# Patient Record
Sex: Female | Born: 1975
Health system: Southern US, Community
[De-identification: ages and names within clinical notes are randomized; demographics above are authoritative.]

## PROBLEM LIST (undated history)

## (undated) ENCOUNTER — Ambulatory Visit (HOSPITAL_COMMUNITY): Payer: Managed Care, Other (non HMO) | Source: Home / Self Care

## (undated) DIAGNOSIS — R002 Palpitations: Secondary | ICD-10-CM

## (undated) HISTORY — PX: UTERINE FIBROID SURGERY: SHX826

## (undated) HISTORY — PX: CYSTECTOMY: SUR359

## (undated) HISTORY — DX: Palpitations: R00.2

## (undated) HISTORY — PX: WISDOM TOOTH EXTRACTION: SHX21

---

## 1997-05-17 ENCOUNTER — Encounter: Admission: RE | Admit: 1997-05-17 | Discharge: 1997-05-17 | Payer: Self-pay | Admitting: Internal Medicine

## 1997-07-05 ENCOUNTER — Encounter: Admission: RE | Admit: 1997-07-05 | Discharge: 1997-07-05 | Payer: Self-pay | Admitting: Internal Medicine

## 1997-11-02 ENCOUNTER — Encounter: Admission: RE | Admit: 1997-11-02 | Discharge: 1997-11-02 | Payer: Self-pay | Admitting: Internal Medicine

## 1998-08-19 ENCOUNTER — Emergency Department (HOSPITAL_COMMUNITY): Admission: EM | Admit: 1998-08-19 | Discharge: 1998-08-20 | Payer: Self-pay | Admitting: Emergency Medicine

## 1999-09-21 ENCOUNTER — Encounter: Admission: RE | Admit: 1999-09-21 | Discharge: 1999-09-21 | Payer: Self-pay | Admitting: Internal Medicine

## 2000-02-21 ENCOUNTER — Encounter: Admission: RE | Admit: 2000-02-21 | Discharge: 2000-02-21 | Payer: Self-pay

## 2000-03-06 ENCOUNTER — Encounter: Admission: RE | Admit: 2000-03-06 | Discharge: 2000-03-06 | Payer: Self-pay | Admitting: Internal Medicine

## 2000-04-11 ENCOUNTER — Encounter: Admission: RE | Admit: 2000-04-11 | Discharge: 2000-04-11 | Payer: Self-pay | Admitting: Internal Medicine

## 2000-04-18 ENCOUNTER — Emergency Department (HOSPITAL_COMMUNITY): Admission: EM | Admit: 2000-04-18 | Discharge: 2000-04-18 | Payer: Self-pay

## 2000-05-02 ENCOUNTER — Encounter: Admission: RE | Admit: 2000-05-02 | Discharge: 2000-07-31 | Payer: Self-pay | Admitting: Internal Medicine

## 2000-08-07 ENCOUNTER — Encounter: Admission: RE | Admit: 2000-08-07 | Discharge: 2000-11-05 | Payer: Self-pay | Admitting: Internal Medicine

## 2000-12-15 ENCOUNTER — Encounter: Admission: RE | Admit: 2000-12-15 | Discharge: 2000-12-15 | Payer: Self-pay | Admitting: Internal Medicine

## 2001-05-28 ENCOUNTER — Encounter: Payer: Self-pay | Admitting: Internal Medicine

## 2001-05-28 ENCOUNTER — Encounter: Admission: RE | Admit: 2001-05-28 | Discharge: 2001-05-28 | Payer: Self-pay | Admitting: Internal Medicine

## 2001-05-28 ENCOUNTER — Ambulatory Visit (HOSPITAL_COMMUNITY): Admission: RE | Admit: 2001-05-28 | Discharge: 2001-05-28 | Payer: Self-pay | Admitting: Internal Medicine

## 2001-06-01 ENCOUNTER — Encounter: Admission: RE | Admit: 2001-06-01 | Discharge: 2001-06-01 | Payer: Self-pay | Admitting: Internal Medicine

## 2001-06-23 ENCOUNTER — Encounter: Admission: RE | Admit: 2001-06-23 | Discharge: 2001-06-23 | Payer: Self-pay | Admitting: Internal Medicine

## 2001-12-31 ENCOUNTER — Encounter: Admission: RE | Admit: 2001-12-31 | Discharge: 2001-12-31 | Payer: Self-pay | Admitting: Internal Medicine

## 2001-12-31 ENCOUNTER — Emergency Department (HOSPITAL_COMMUNITY): Admission: EM | Admit: 2001-12-31 | Discharge: 2001-12-31 | Payer: Self-pay | Admitting: Emergency Medicine

## 2002-05-01 ENCOUNTER — Emergency Department (HOSPITAL_COMMUNITY): Admission: EM | Admit: 2002-05-01 | Discharge: 2002-05-02 | Payer: Self-pay | Admitting: Emergency Medicine

## 2002-05-25 ENCOUNTER — Encounter: Admission: RE | Admit: 2002-05-25 | Discharge: 2002-05-25 | Payer: Self-pay | Admitting: Obstetrics and Gynecology

## 2002-06-24 ENCOUNTER — Encounter: Admission: RE | Admit: 2002-06-24 | Discharge: 2002-06-24 | Payer: Self-pay | Admitting: Obstetrics and Gynecology

## 2002-06-24 ENCOUNTER — Encounter (INDEPENDENT_AMBULATORY_CARE_PROVIDER_SITE_OTHER): Payer: Self-pay

## 2002-06-24 ENCOUNTER — Other Ambulatory Visit: Admission: RE | Admit: 2002-06-24 | Discharge: 2002-06-24 | Payer: Self-pay | Admitting: *Deleted

## 2002-08-19 ENCOUNTER — Encounter: Admission: RE | Admit: 2002-08-19 | Discharge: 2002-08-19 | Payer: Self-pay | Admitting: Obstetrics and Gynecology

## 2003-05-03 ENCOUNTER — Encounter (INDEPENDENT_AMBULATORY_CARE_PROVIDER_SITE_OTHER): Payer: Self-pay | Admitting: *Deleted

## 2003-05-03 ENCOUNTER — Encounter: Admission: RE | Admit: 2003-05-03 | Discharge: 2003-05-03 | Payer: Self-pay | Admitting: Obstetrics and Gynecology

## 2003-07-07 ENCOUNTER — Emergency Department (HOSPITAL_COMMUNITY): Admission: EM | Admit: 2003-07-07 | Discharge: 2003-07-07 | Payer: Self-pay | Admitting: Emergency Medicine

## 2003-09-15 ENCOUNTER — Emergency Department (HOSPITAL_COMMUNITY): Admission: EM | Admit: 2003-09-15 | Discharge: 2003-09-15 | Payer: Self-pay | Admitting: Family Medicine

## 2003-10-12 ENCOUNTER — Emergency Department (HOSPITAL_COMMUNITY): Admission: EM | Admit: 2003-10-12 | Discharge: 2003-10-12 | Payer: Self-pay | Admitting: Family Medicine

## 2003-10-27 ENCOUNTER — Ambulatory Visit: Payer: Self-pay | Admitting: Obstetrics and Gynecology

## 2004-01-24 ENCOUNTER — Ambulatory Visit: Payer: Self-pay | Admitting: Obstetrics & Gynecology

## 2004-07-24 ENCOUNTER — Emergency Department (HOSPITAL_COMMUNITY): Admission: EM | Admit: 2004-07-24 | Discharge: 2004-07-24 | Payer: Self-pay | Admitting: Emergency Medicine

## 2004-08-18 ENCOUNTER — Emergency Department (HOSPITAL_COMMUNITY): Admission: EM | Admit: 2004-08-18 | Discharge: 2004-08-18 | Payer: Self-pay | Admitting: Family Medicine

## 2004-10-30 ENCOUNTER — Ambulatory Visit: Payer: Self-pay | Admitting: Obstetrics and Gynecology

## 2004-10-30 ENCOUNTER — Encounter (INDEPENDENT_AMBULATORY_CARE_PROVIDER_SITE_OTHER): Payer: Self-pay | Admitting: *Deleted

## 2004-12-16 ENCOUNTER — Emergency Department (HOSPITAL_COMMUNITY): Admission: EM | Admit: 2004-12-16 | Discharge: 2004-12-16 | Payer: Self-pay | Admitting: Emergency Medicine

## 2005-06-05 IMAGING — CR DG CHEST 2V
2 series · 2 of 2 positions shown · non-contrast
Comparison: none

CLINICAL DATA: Cough, chest pain, shortness of breath. 
 TWO VIEW CHEST RADIOGRAPH ? 07/07/2003
 Comparing 05/28/2001. 
 The heart size and mediastinal contours are unremarkable.  The lungs are clear.  The visualized skeleton is unremarkable.  
 IMPRESSION
 No active disease.

[view not recorded (1 of 2)]
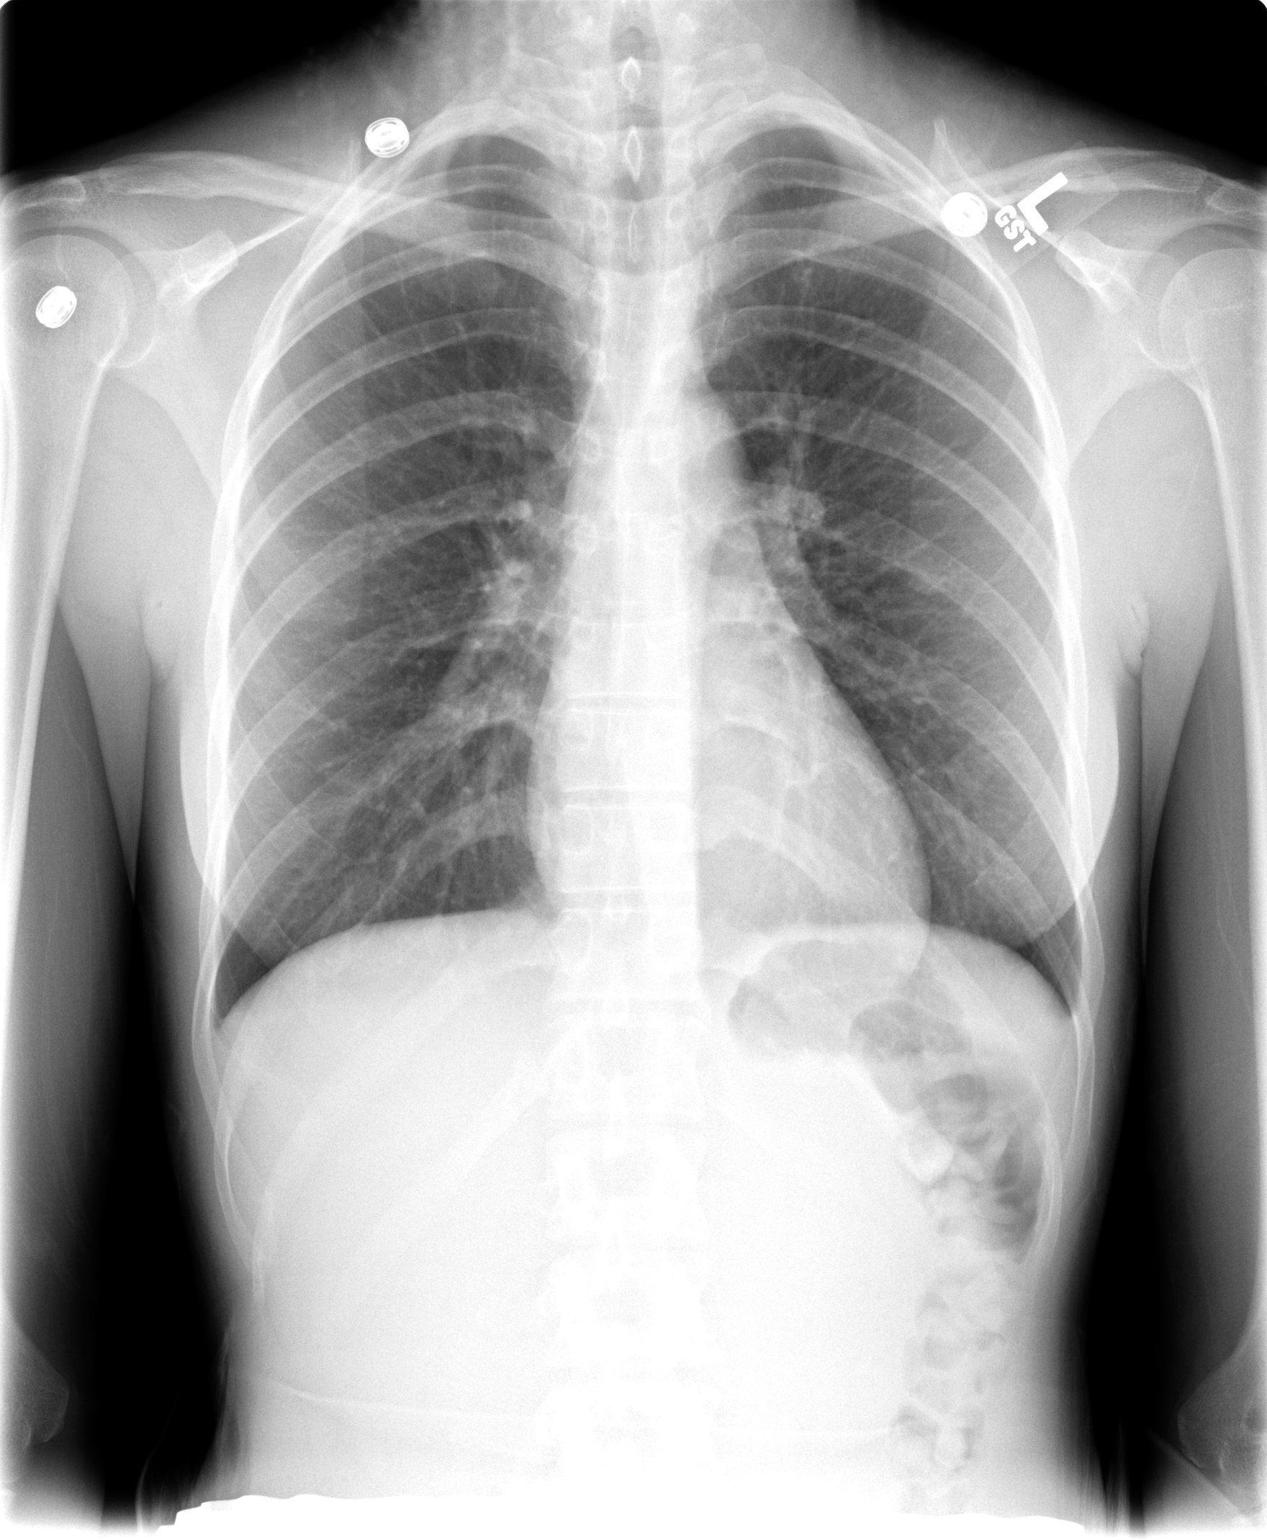

[view not recorded (2 of 2)]
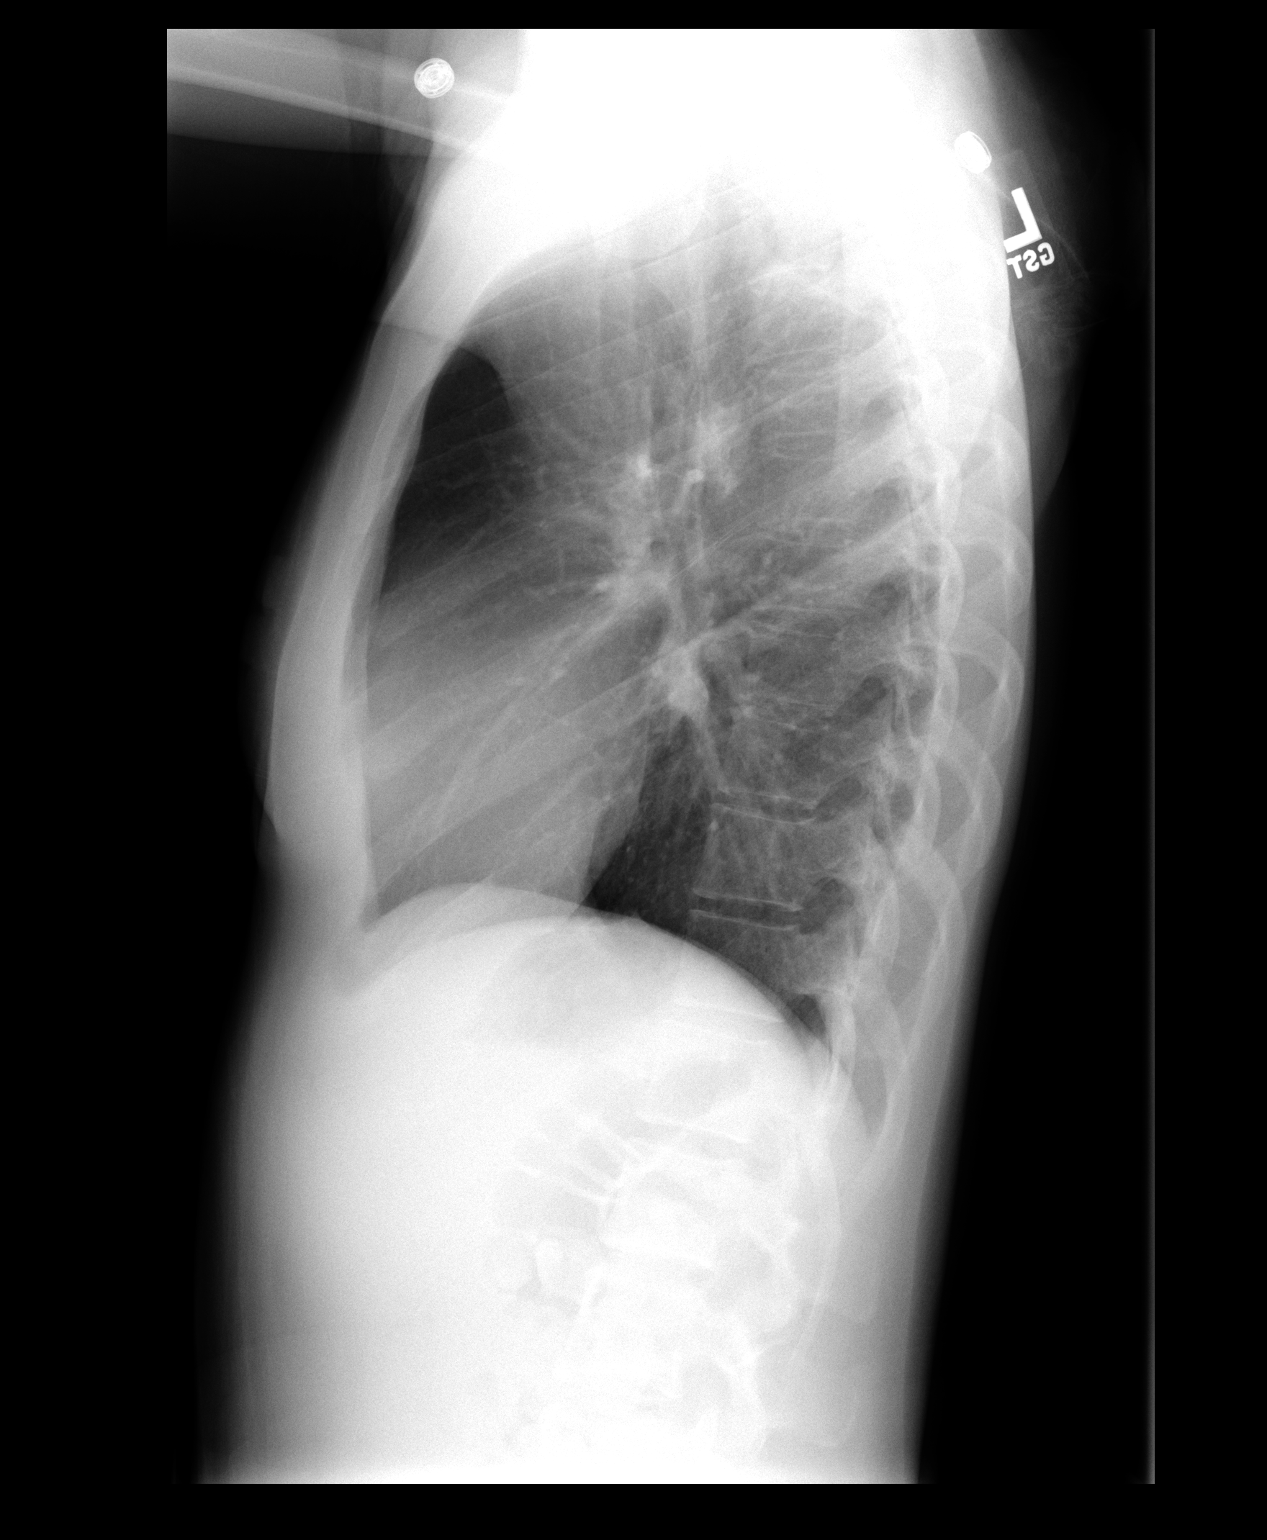

[2 of 2 positions shown; findings below may reference images not displayed]

## 2005-08-22 ENCOUNTER — Ambulatory Visit: Payer: Self-pay | Admitting: Obstetrics and Gynecology

## 2005-10-10 ENCOUNTER — Encounter (INDEPENDENT_AMBULATORY_CARE_PROVIDER_SITE_OTHER): Payer: Self-pay | Admitting: Obstetrics & Gynecology

## 2005-10-10 ENCOUNTER — Ambulatory Visit: Payer: Self-pay | Admitting: Obstetrics & Gynecology

## 2005-10-22 ENCOUNTER — Ambulatory Visit: Payer: Self-pay | Admitting: Obstetrics and Gynecology

## 2005-12-11 ENCOUNTER — Ambulatory Visit: Payer: Self-pay | Admitting: Obstetrics and Gynecology

## 2006-01-21 ENCOUNTER — Emergency Department (HOSPITAL_COMMUNITY): Admission: EM | Admit: 2006-01-21 | Discharge: 2006-01-21 | Payer: Self-pay | Admitting: Family Medicine

## 2006-02-23 ENCOUNTER — Emergency Department (HOSPITAL_COMMUNITY): Admission: EM | Admit: 2006-02-23 | Discharge: 2006-02-23 | Payer: Self-pay | Admitting: Emergency Medicine

## 2006-06-04 ENCOUNTER — Ambulatory Visit: Payer: Self-pay | Admitting: Obstetrics and Gynecology

## 2006-08-20 ENCOUNTER — Ambulatory Visit: Payer: Self-pay | Admitting: Obstetrics and Gynecology

## 2006-10-15 ENCOUNTER — Ambulatory Visit: Payer: Self-pay | Admitting: *Deleted

## 2006-10-15 ENCOUNTER — Encounter: Payer: Self-pay | Admitting: Obstetrics and Gynecology

## 2006-10-15 ENCOUNTER — Encounter: Payer: Self-pay | Admitting: Obstetrics & Gynecology

## 2007-03-30 ENCOUNTER — Emergency Department (HOSPITAL_COMMUNITY): Admission: EM | Admit: 2007-03-30 | Discharge: 2007-03-30 | Payer: Self-pay | Admitting: Family Medicine

## 2007-10-16 ENCOUNTER — Emergency Department (HOSPITAL_COMMUNITY): Admission: EM | Admit: 2007-10-16 | Discharge: 2007-10-16 | Payer: Self-pay | Admitting: Family Medicine

## 2009-02-09 ENCOUNTER — Ambulatory Visit (HOSPITAL_COMMUNITY): Admission: RE | Admit: 2009-02-09 | Discharge: 2009-02-09 | Payer: Self-pay | Admitting: Obstetrics and Gynecology

## 2010-05-14 LAB — CBC
MCHC: 31.5 g/dL (ref 30.0–36.0)
MCV: 76.8 fL — ABNORMAL LOW (ref 78.0–100.0)
Platelets: 390 10*3/uL (ref 150–400)

## 2010-05-14 LAB — BASIC METABOLIC PANEL
BUN: 15 mg/dL (ref 6–23)
CO2: 25 mEq/L (ref 19–32)
Calcium: 8.7 mg/dL (ref 8.4–10.5)
Chloride: 107 mEq/L (ref 96–112)
Creatinine, Ser: 0.61 mg/dL (ref 0.4–1.2)
GFR calc Af Amer: >60 mL/min (ref >60)
GFR calc non Af Amer: >60 mL/min (ref >60)
Glucose, Bld: 61 mg/dL — ABNORMAL LOW (ref 70–99)
Potassium: 3.8 mEq/L (ref 3.5–5.1)
Sodium: 135 mEq/L (ref 135–145)

## 2010-05-14 LAB — PREGNANCY, URINE: Preg Test, Ur: NEGATIVE

## 2010-06-26 NOTE — Group Therapy Note (Signed)
NAME:  Pamela Holland, Pamela Holland NO.:  [id number]   MEDICAL RECORD NO.:  [id number]          PATIENT TYPE:  WOC   LOCATION:  [facility]                   FACILITY:  [facility]   PHYSICIAN:  Elsie Lincoln, MD      DATE OF BIRTH:  1976/01/08   DATE OF SERVICE:  08/20/2006                                  CLINIC NOTE   CHIEF COMPLAINT:  Heavy bleeding on Depo-Provera.   HISTORY OF PRESENT ILLNESS:  This is a 35 year old African-American  female who is due today for her second Depo-Provera injection.  Her  first injection was in April of 2008 and since then she has had  irregular and heavy bleeding ever since then.  She also has had four  migraines since introduction of Depo-Provera which she has never had in  the past as well as some heavy menstrual cramping.  She does not want to  continue Depo-Provera at this time and is interested in discussing birth  control options.  She utilizes birth control mainly for a history of  heavy periods with menstrual cramping.  She is not sexually active and  does not plan on becoming sexually active any time in the near future.  She has used Ortho-Tri-Cyclen in the past with a side effect of heavy  bleeding.  She then switched to Ovcon 35 FE.  The side effect of this  oral contraceptive pill is unknown.  She then tried the NuvaRing which  also resulted in a side effect of heavy bleeding.  She has since been on  Depo-Provera with the same side effect.   PHYSICAL EXAMINATION:  VITAL SIGNS:  Stable.  GENERAL APPEARANCE:  The patient is alert, oriented and pleasant,  appears in no acute distress.   ASSESSMENT/PLAN:  A 35 year old with a history of heavy periods and  menstrual cramping here for advice on birth control.  She was given a  one month supply of Yaz oral contraception as well as a prescription  with three refills.  If she still has breakthrough bleeding on this oral  contraceptive pill, we may consider a contraceptive pill with a higher  estrogen dose.  She will return to clinic in September 2008 for her  yearly annual with Pap smear.     ______________________________  Sylvan Cheese, MD    ______________________________  Elsie Lincoln, MD    MJ/MEDQ  D:  08/20/2006  T:  08/21/2006  Job:  [id number]

## 2010-06-29 NOTE — Group Therapy Note (Signed)
NAME:  Pamela Holland, GANDOLFI NO.:  [id number]   MEDICAL RECORD NO.:  [id number]          PATIENT TYPE:  WOC   LOCATION:  [facility]                   FACILITY:  [facility]   PHYSICIAN:  Dorthula Perfect, MD     DATE OF BIRTH:  1975/11/03   DATE OF SERVICE:  10/10/2005                                    CLINIC NOTE   A 35 year old black female returns for her yearly Pap smear.  Over the past  year or so she has had problems with bleeding and spotting despite being on  a variety of different birth control pills.  Two months ago the NuvaRing was  prescribed for her.  Her first period was described as very heavy with  especially heavy for 3 days.  Her last period, September 26, 2005, was markedly  better with only a moderate amount of bleeding on the first day.  She does  have some cramps.   PHYSICAL EXAMINATION:  BREASTS:  Breasts are normal.  ABDOMEN:  Abdomen is soft, nontender.  No masses are felt.  PELVIC EXAM:  External genitalia, and __________ glands were normal.  __________  cervix.  Uterus was in the midline of normal size and shape.  Adnexal structures were normal.  No masses were felt.   IMPRESSION:  1. Normal GYN exam.  2. Contraception.   DISPOSITION:  1. Pap smear.  2. Patient will continue to use the NuvaRing for at least the next few      months.  Hopefully, as her system gets used to the low dose that her      periods will become lighter and will not be a problem.  She is willing      to wait this out at least for the next few months.           ______________________________  Dorthula Perfect, MD     ER/MEDQ  D:  10/10/2005  T:  10/11/2005  Job:  [id number]

## 2010-06-29 NOTE — Group Therapy Note (Signed)
NAME:  MELLINA, BENISON NO.:  [id number]   MEDICAL RECORD NO.:  [id number]          PATIENT TYPE:  WOC   LOCATION:  [facility]                   FACILITY:  [facility]   PHYSICIAN:  Argentina Donovan, MD        DATE OF BIRTH:  Sep 21, 1975   DATE OF SERVICE:                                    CLINIC NOTE   The patient is a 35 year old African American female who has been several  months on the Nuva  ring.  She has been getting a vaginal irritation and she  love the Nuva ring but thinks it may be related.  We have told her to leave  the Nuva ring out.  We gave her several samples of Loestrin FE and some  information on the __________  and Mirena as an alternative if we do find  that this is the Nuva ring that is causing her irritation.   PHYSICAL EXAMINATION:  EXTERNAL GENITALIA:  Normal with an obvious heavy  creamy discharge noted at the introitus.  BUS within normal limits.  PELVIC:  Vagina is clean with a heavy milky watery discharge in the cul-de-  sac.  Cervix is clean and parous.   A wet prep and a DNA for GC and chlamydia were taken.  We will call the  patient with those results.  She will let us know what she wants to use as a  substitute.  She is to start the birth control pills today, so there is no  break in her coverage.   IMPRESSION:  Vaginal irritation, possibly secondary to Nuva ring.   Evaluation for vaginal infection carried out           ______________________________  Argentina Donovan, MD     PR/MEDQ  D:  12/11/2005  T:  12/11/2005  Job:  [id number]
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
## 2010-06-29 NOTE — Group Therapy Note (Signed)
NAME:  Pamela Holland, Pamela Holland NO.:  [id number]   MEDICAL RECORD NO.:  [id number]                   PATIENT TYPE:  OUT   LOCATION:  [facility]                           FACILITY:  [facility]   PHYSICIAN:  Argentina Donovan, MD                     DATE OF BIRTH:  09/26/1975   DATE OF SERVICE:  05/03/2003                                    CLINIC NOTE   REASON FOR VISIT:  Pamela Holland is a 35 year old African-American female that  is here for repeat Pap smear.  The patient was first seen here in February  2004 with an LSIL and HPV.  She had a colposcopy done on Jun 24, 2002 and a  follow-up Pap smear done in July 2004 that was okay.  She is now here for  repeat Pap smear 6 months later.  The patient has been taking Ortho Tri-  Cyclen as she does not smoke.  She also wants to get information on  Seasonale birth control pills.   MEDICATIONS:  None at this point.   PAST MEDICAL HISTORY:  Uneventful.   REVIEW OF SYSTEMS:  Essentially normal.   GYNECOLOGICAL HISTORY:  The patient's last menstrual period was April 20, 2003.  She is a gravida 0 para 0.   PHYSICAL EXAMINATION:  VITAL SIGNS:  Her temperature is 98.3, blood pressure  is 114/69, her pulse is 78, her weight is 116.7  GENERAL:  She is alert, oriented, thin woman in no acute distress.  CHEST:  Clear to auscultation bilaterally.  CARDIOVASCULAR:  She has regular rate and rhythm.  ABDOMEN:  Soft, nontender, with positive bowel sounds.  EXTREMITIES:  Show no edema, cyanosis, or clubbing.  PELVIC:  Showed normal vulva, posterior cervix.  There is no evidence of  bleed or discharge.  Pap smear is performed today.   ASSESSMENT AND PLAN:  1. Follow-up Pap smear.  The patient's Pap smear is performed today and we     will check it.  The plan is if it is normal the patient will go back to     Pap smear once a year.  If is it abnormal then the patient will be     reviewed again and have a Pap smear done probably in 3 months  rather than     a year.  2. Birth control pills.  The patient was given information on the Seasonale     birth control pills for her to read and make a decision.  In the     meantime, I have refilled her Ortho Tri-Cyclen.     Cassandria Santee, M.D.                Argentina Donovan, MD    MLG/MEDQ  D:  05/03/2003  T:  05/03/2003  Job:  [id number]

## 2010-11-02 LAB — STREP A DNA PROBE

## 2010-11-02 LAB — POCT RAPID STREP A: Streptococcus, Group A Screen (Direct): NEGATIVE

## 2011-12-20 ENCOUNTER — Emergency Department (HOSPITAL_COMMUNITY)
Admission: EM | Admit: 2011-12-20 | Discharge: 2011-12-20 | Disposition: A | Discharge status: Home / Self Care | Payer: 59 | Source: Home / Self Care

## 2011-12-20 ENCOUNTER — Encounter (HOSPITAL_COMMUNITY): Payer: Self-pay | Admitting: Emergency Medicine

## 2011-12-20 DIAGNOSIS — J309 Allergic rhinitis, unspecified: Secondary | ICD-10-CM

## 2011-12-20 MED ORDER — PHENYLEPHRINE-CHLORPHEN-DM 10-4-12.5 MG/5ML PO LIQD: 5.0000 mL | ORAL | Status: DC | PRN

## 2011-12-20 NOTE — ED Provider Notes (Signed)
Medical screening examination/treatment/procedure(s) were performed by non-physician practitioner and as supervising physician I was immediately available for consultation/collaboration.  Leslee Home, M.D.   Reuben Likes, MD 12/20/11 2109

## 2011-12-20 NOTE — ED Notes (Signed)
Reports runny nose, alternating watery eyes, cough.  Reports that sputum was a lime green initially.  now patient reports she cannot cough anything up.  Denies fever.  Reports tickle in throat triggering a repetitive cough

## 2011-12-20 NOTE — ED Provider Notes (Signed)
History     CSN: [id number]  Arrival date & time 12/20/11  1912   None     Chief Complaint  Patient presents with  . URI    (Consider location/radiation/quality/duration/timing/severity/associated sxs/prior treatment) HPI Comments: 36 year old female is had a cough for several weeks. She is complaining of runny nose, watery and itchy eyes, PND and equivocal sore throat. She denies fever or earache.  Patient is a 36 y.o. female presenting with URI.  URI The primary symptoms include fatigue and sore throat. Primary symptoms do not include fever or rash.  Symptoms associated with the illness include congestion and rhinorrhea. The illness is not associated with chills.    History reviewed. No pertinent past medical history.  History reviewed. No pertinent past surgical history.  No family history on file.  History  Substance Use Topics  . Smoking status: Never Smoker   . Smokeless tobacco: Not on file  . Alcohol Use: No    OB History    Grav Para Term Preterm Abortions TAB SAB Ect Mult Living                  Review of Systems  Constitutional: Positive for fatigue. Negative for fever, chills, activity change and appetite change.  HENT: Positive for congestion, sore throat, rhinorrhea and postnasal drip. Negative for facial swelling, neck pain and neck stiffness.   Eyes: Positive for discharge and itching.  Respiratory: Negative.   Cardiovascular: Negative.   Gastrointestinal: Negative.   Skin: Negative for pallor and rash.  Neurological: Negative.     Allergies  Review of patient's allergies indicates no known allergies.  Home Medications   Current Outpatient Rx  Name  Route  Sig  Dispense  Refill  . VICKS VAPORUB EX   Apply externally   Apply topically.         . NYQUIL PO   Oral   Take by mouth.         Marland Kitchen PHENYLEPHRINE-CHLORPHEN-DM 11-15-10.5 MG/5ML PO LIQD   Oral   Take 5 mLs by mouth every 4 (four) hours as needed.   120 mL   0     BP  147/73  Pulse 84  Temp 99.2 F (37.3 C) (Oral)  Resp 22  SpO2 100%  LMP 11/30/2011  Physical Exam  Constitutional: She is oriented to person, place, and time. She appears well-developed and well-nourished. No distress.  HENT:  Right Ear: External ear normal.  Left Ear: External ear normal.       OP with minor erythema and clear to slightly opaque PND.  Eyes: Conjunctivae normal and EOM are normal.  Neck: Normal range of motion. Neck supple.  Cardiovascular: Normal rate, regular rhythm and normal heart sounds.   Pulmonary/Chest: Effort normal and breath sounds normal. No respiratory distress. She has no wheezes.  Musculoskeletal: Normal range of motion. She exhibits no edema.  Lymphadenopathy:    She has no cervical adenopathy.  Neurological: She is alert and oriented to person, place, and time.  Skin: Skin is warm and dry. No rash noted.  Psychiatric: She has a normal mood and affect.    ED Course  Procedures (including critical care time)  Labs Reviewed - No data to display No results found.   1. Allergic rhinitis       MDM  Norell CS 1 teaspoon every 4 hours when necessary cough and drainage. Drink plenty of fluids stay well hydrated He may benefit from seeing an allergist. Also follow up with  your primary care provider        Hayden Rasmussen, NP 12/20/11 [phone]

## 2012-06-08 ENCOUNTER — Emergency Department (HOSPITAL_COMMUNITY)
Admission: EM | Admit: 2012-06-08 | Discharge: 2012-06-08 | Disposition: A | Discharge status: Home / Self Care | Payer: 59 | Source: Home / Self Care | Attending: Emergency Medicine | Admitting: Emergency Medicine

## 2012-06-08 ENCOUNTER — Encounter (HOSPITAL_COMMUNITY): Payer: Self-pay | Admitting: Emergency Medicine

## 2012-06-08 DIAGNOSIS — H6692 Otitis media, unspecified, left ear: Secondary | ICD-10-CM

## 2012-06-08 DIAGNOSIS — H6690 Otitis media, unspecified, unspecified ear: Secondary | ICD-10-CM

## 2012-06-08 MED ORDER — HYDROCODONE-ACETAMINOPHEN 5-325 MG PO TABS: 2.0000 | ORAL_TABLET | ORAL | Status: DC | PRN

## 2012-06-08 MED ORDER — AUGMENTIN 500-125 MG PO TABS: 1.0000 | ORAL_TABLET | Freq: Three times a day (TID) | ORAL | Status: DC

## 2012-06-08 MED ORDER — ANTIPYRINE-BENZOCAINE 5.4-1.4 % OT SOLN: 3.0000 [drp] | OTIC | Status: DC | PRN

## 2012-06-08 NOTE — ED Provider Notes (Signed)
History     CSN: [id number]  Arrival date & time 06/08/12  1247   First MD Initiated Contact with Patient 06/08/12 1419      Chief Complaint  Patient presents with  . Otalgia    (Consider location/radiation/quality/duration/timing/severity/associated sxs/prior treatment) The history is provided by the patient.  c/o left earache upon waking up this morning No fever, sore throat or sinus congestion; had sore throat and sinus drainage 2 weeks ago  History reviewed. No pertinent past medical history. No past medical history  Past Surgical History  Procedure Laterality Date  . Cystectomy     No past surgical history No family history on file. noncontribuyory History  Substance Use Topics  . Smoking status: Never Smoker   . Smokeless tobacco: Not on file  . Alcohol Use: No    OB History   Grav Para Term Preterm Abortions TAB SAB Ect Mult Living                  Review of Systems  Constitutional: Negative.   HENT: Positive for ear pain.   All other systems reviewed and are negative.    Allergies  Review of patient's allergies indicates no known allergies.  Home Medications   Current Outpatient Rx  Name  Route  Sig  Dispense  Refill  . antipyrine-benzocaine (AURALGAN) otic solution   Left Ear   Place 3 drops into the left ear every 4 (four) hours as needed for pain.   10 mL   0   . AUGMENTIN 500-125 MG per tablet   Oral   Take 1 tablet (500 mg total) by mouth 3 (three) times daily.   30 tablet   0     Dispense as written.   . Camphor-Eucalyptus-Menthol (VICKS VAPORUB EX)   Apply externally   Apply topically.         Marland Kitchen HYDROcodone-acetaminophen (NORCO/VICODIN) 5-325 MG per tablet   Oral   Take 2 tablets by mouth every 4 (four) hours as needed for pain.   15 tablet   0   . Phenylephrine-Chlorphen-DM 11-15-10.5 MG/5ML LIQD   Oral   Take 5 mLs by mouth every 4 (four) hours as needed.   120 mL   0   . Pseudoeph-Doxylamine-DM-APAP (NYQUIL PO)   Oral   Take by mouth.           BP 112/79  Pulse 63  Temp(Src) 98.3 F (36.8 C) (Oral)  Resp 16  SpO2 100%  LMP 05/23/2012  Physical Exam  Constitutional: She appears well-developed and well-nourished.  HENT:  Right Ear: External ear normal.  LEFT EAR SHOWED NORMAL PINNA,EXTERNAL CANAL,HYPEREMIC TM ,INTACT WITH NO DRAINAGE  Skin: Skin is warm.    ED Course  Procedures (including critical care time)  Labs Reviewed - No data to display No results found.   1. Otitis media of left ear       MDM          Jani Files, MD 06/10/12 [id number]

## 2012-06-08 NOTE — ED Notes (Signed)
Pt c/o left ear pain onset this am Also c/o cold sx onset 1.5 weeks Sx include: nasal congestion, post nasal drip,   Denies: f/v/n/d, cough Taking advil for discomfort w/temp relief.   She is alert and oriented w/no signs of acute distress.

## 2013-03-23 ENCOUNTER — Encounter (HOSPITAL_COMMUNITY): Payer: Self-pay | Admitting: Emergency Medicine

## 2013-03-23 ENCOUNTER — Emergency Department (INDEPENDENT_AMBULATORY_CARE_PROVIDER_SITE_OTHER)
Admission: EM | Admit: 2013-03-23 | Discharge: 2013-03-23 | Disposition: A | Discharge status: Home / Self Care | Payer: Managed Care, Other (non HMO) | Source: Home / Self Care | Attending: Family Medicine | Admitting: Family Medicine

## 2013-03-23 DIAGNOSIS — J020 Streptococcal pharyngitis: Secondary | ICD-10-CM

## 2013-03-23 MED ORDER — AMOXICILLIN 875 MG PO TABS: 875.0000 mg | ORAL_TABLET | Freq: Two times a day (BID) | ORAL | Status: DC

## 2013-03-23 MED ORDER — METHYLPREDNISOLONE 4 MG PO KIT: PACK | ORAL | Status: DC

## 2013-03-23 MED ORDER — HYDROCODONE-ACETAMINOPHEN 5-325 MG PO TABS: 1.0000 | ORAL_TABLET | Freq: Four times a day (QID) | ORAL | Status: DC | PRN

## 2013-03-23 NOTE — Discharge Instructions (Signed)
Strep Throat  Strep throat is an infection of the throat caused by a bacteria named Streptococcus pyogenes. Your caregiver may call the infection streptococcal "tonsillitis" or "pharyngitis" depending on whether there are signs of inflammation in the tonsils or back of the throat. Strep throat is most common in children aged 36 15 years during the cold months of the year, but it can occur in people of any age during any season. This infection is spread from person to person (contagious) through coughing, sneezing, or other close contact.  SYMPTOMS   · Fever or chills.  · Painful, swollen, red tonsils or throat.  · Pain or difficulty when swallowing.  · White or yellow spots on the tonsils or throat.  · Swollen, tender lymph nodes or "glands" of the neck or under the jaw.  · Red rash all over the body (rare).  DIAGNOSIS   Many different infections can cause the same symptoms. A test must be done to confirm the diagnosis so the right treatment can be given. A "rapid strep test" can help your caregiver make the diagnosis in a few minutes. If this test is not available, a light swab of the infected area can be used for a throat culture test. If a throat culture test is done, results are usually available in a day or two.  TREATMENT   Strep throat is treated with antibiotic medicine.  HOME CARE INSTRUCTIONS   · Gargle with 1 tsp of salt in 1 cup of warm water, 3 4 times per day or as needed for comfort.  · Family members who also have a sore throat or fever should be tested for strep throat and treated with antibiotics if they have the strep infection.  · Make sure everyone in your household washes their hands well.  · Do not share food, drinking cups, or personal items that could cause the infection to spread to others.  · You may need to eat a soft food diet until your sore throat gets better.  · Drink enough water and fluids to keep your urine clear or pale yellow. This will help prevent dehydration.  · Get plenty of  rest.  · Stay home from school, daycare, or work until you have been on antibiotics for 24 hours.  · Only take over-the-counter or prescription medicines for pain, discomfort, or fever as directed by your caregiver.  · If antibiotics are prescribed, take them as directed. Finish them even if you start to feel better.  SEEK MEDICAL CARE IF:   · The glands in your neck continue to enlarge.  · You develop a rash, cough, or earache.  · You cough up green, yellow-brown, or bloody sputum.  · You have pain or discomfort not controlled by medicines.  · Your problems seem to be getting worse rather than better.  SEEK IMMEDIATE MEDICAL CARE IF:   · You develop any new symptoms such as vomiting, severe headache, stiff or painful neck, chest pain, shortness of breath, or trouble swallowing.  · You develop severe throat pain, drooling, or changes in your voice.  · You develop swelling of the neck, or the skin on the neck becomes red and tender.  · You have a fever.  · You develop signs of dehydration, such as fatigue, dry mouth, and decreased urination.  · You become increasingly sleepy, or you cannot wake up completely.  Document Released: 01/26/2000 Document Revised: 01/15/2012 Document Reviewed: 03/29/2010  [employer]® Patient Information ©2014 [employer].

## 2013-03-23 NOTE — ED Notes (Signed)
Pt triaged and assessed by provider.   Provider in before nurse. 

## 2013-03-23 NOTE — ED Provider Notes (Signed)
CSN: [id number]     Arrival date & time 03/23/13  1920 History   First MD Initiated Contact with Patient 03/23/13 2004     Chief Complaint  Patient presents with  . URI     (Consider location/radiation/quality/duration/timing/severity/associated sxs/prior Treatment) HPI Comments: 38 year old female presents complaining of sore throat, headache, neck pain, subjective fever and chills. This has been going on since yesterday with throat, and fever and chills first started this morning. She has not yet taken any medications for this. She denies cough, nausea, vomiting, congestion, rhinorrhea   No past medical history on file. Past Surgical History  Procedure Laterality Date  . Cystectomy     No family history on file. History  Substance Use Topics  . Smoking status: Never Smoker   . Smokeless tobacco: Not on file  . Alcohol Use: No   OB History   Grav Para Term Preterm Abortions TAB SAB Ect Mult Living                 Review of Systems  Constitutional: Positive for fever, chills and fatigue.  HENT: Positive for sore throat.   Eyes: Negative for visual disturbance.  Respiratory: Negative for cough and shortness of breath.   Cardiovascular: Negative for chest pain, palpitations and leg swelling.  Gastrointestinal: Negative for nausea, vomiting and abdominal pain.  Endocrine: Negative for polydipsia and polyuria.  Genitourinary: Negative for dysuria, urgency and frequency.  Musculoskeletal: Negative for arthralgias and myalgias.  Skin: Negative for rash.  Neurological: Positive for dizziness and headaches. Negative for weakness and light-headedness.      Allergies  Review of patient's allergies indicates no known allergies.  Home Medications   Current Outpatient Rx  Name  Route  Sig  Dispense  Refill  . amoxicillin (AMOXIL) 875 MG tablet   Oral   Take 1 tablet (875 mg total) by mouth 2 (two) times daily.   14 tablet   0   . antipyrine-benzocaine (AURALGAN) otic  solution   Left Ear   Place 3 drops into the left ear every 4 (four) hours as needed for pain.   10 mL   0   . AUGMENTIN 500-125 MG per tablet   Oral   Take 1 tablet (500 mg total) by mouth 3 (three) times daily.   30 tablet   0     Dispense as written.   . Camphor-Eucalyptus-Menthol (VICKS VAPORUB EX)   Apply externally   Apply topically.         Marland Kitchen HYDROcodone-acetaminophen (NORCO) 5-325 MG per tablet   Oral   Take 1 tablet by mouth every 6 (six) hours as needed for moderate pain.   15 tablet   0   . HYDROcodone-acetaminophen (NORCO/VICODIN) 5-325 MG per tablet   Oral   Take 2 tablets by mouth every 4 (four) hours as needed for pain.   15 tablet   0   . methylPREDNISolone (MEDROL DOSEPAK) 4 MG tablet      follow package directions   21 tablet   0     Dispense as written.   Marland Kitchen Phenylephrine-Chlorphen-DM 11-15-10.5 MG/5ML LIQD   Oral   Take 5 mLs by mouth every 4 (four) hours as needed.   120 mL   0   . Pseudoeph-Doxylamine-DM-APAP (NYQUIL PO)   Oral   Take by mouth.          BP 112/76  Pulse 88  Temp(Src) 99.5 F (37.5 C) (Oral)  Resp 12  SpO2 99%  Physical Exam  Nursing note and vitals reviewed. Constitutional: She is oriented to person, place, and time. Vital signs are normal. She appears well-developed and well-nourished. No distress.  HENT:  Head: Normocephalic and atraumatic.  Nose: Nose normal. Right sinus exhibits no maxillary sinus tenderness and no frontal sinus tenderness. Left sinus exhibits no maxillary sinus tenderness and no frontal sinus tenderness.  Mouth/Throat: Uvula is midline. Oropharyngeal exudate and posterior oropharyngeal erythema present. No posterior oropharyngeal edema.  Equal bilateral Tonsillar enlargements with purulence/exudate  Neck: Normal range of motion. Neck supple.  Cardiovascular: Normal rate, regular rhythm and normal heart sounds.   Pulmonary/Chest: Effort normal and breath sounds normal. No respiratory  distress.  Lymphadenopathy:    She has cervical adenopathy (tonsillar, posterior cervical, tender).  Neurological: She is alert and oriented to person, place, and time. She has normal strength. Coordination normal.  Skin: Skin is warm and dry. No rash noted. She is not diaphoretic.  Psychiatric: She has a normal mood and affect. Judgment normal.    ED Course  Procedures (including critical care time) Labs Review Labs Reviewed - No data to display Imaging Review No results found.    MDM   Final diagnoses:  Strep pharyngitis    Meets all 3/4 Center criteria. We'll treat for strep pharyngitis. Followup if not improving.   Meds ordered this encounter  Medications  . amoxicillin (AMOXIL) 875 MG tablet    Sig: Take 1 tablet (875 mg total) by mouth 2 (two) times daily.    Dispense:  14 tablet    Refill:  0    Order Specific Question:  Supervising Provider    Answer:  Billy Fischer [phone]  . methylPREDNISolone (MEDROL DOSEPAK) 4 MG tablet    Sig: follow package directions    Dispense:  21 tablet    Refill:  0    Order Specific Question:  Supervising Provider    Answer:  Billy Fischer [phone]  . HYDROcodone-acetaminophen (NORCO) 5-325 MG per tablet    Sig: Take 1 tablet by mouth every 6 (six) hours as needed for moderate pain.    Dispense:  15 tablet    Refill:  0    Order Specific Question:  Supervising Provider    Answer:  Ihor Gully D Beadle, PA-C 03/23/13 2018

## 2013-03-24 NOTE — ED Provider Notes (Signed)
Medical screening examination/treatment/procedure(s) were performed by a resident physician or non-physician practitioner and as the supervising physician I was immediately available for consultation/collaboration.  Lynne Leader, MD    Gregor Hams, MD 03/24/13 [phone]

## 2013-12-07 ENCOUNTER — Encounter (HOSPITAL_COMMUNITY): Payer: Self-pay | Admitting: Emergency Medicine

## 2013-12-07 ENCOUNTER — Emergency Department (HOSPITAL_COMMUNITY)
Admission: EM | Admit: 2013-12-07 | Discharge: 2013-12-07 | Disposition: A | Discharge status: Home / Self Care | Payer: Managed Care, Other (non HMO) | Attending: Emergency Medicine | Admitting: Emergency Medicine

## 2013-12-07 ENCOUNTER — Emergency Department (HOSPITAL_COMMUNITY): Payer: Managed Care, Other (non HMO)

## 2013-12-07 DIAGNOSIS — Z3202 Encounter for pregnancy test, result negative: Secondary | ICD-10-CM | POA: Diagnosis not present

## 2013-12-07 DIAGNOSIS — R079 Chest pain, unspecified: Secondary | ICD-10-CM | POA: Diagnosis present

## 2013-12-07 DIAGNOSIS — R002 Palpitations: Secondary | ICD-10-CM | POA: Insufficient documentation

## 2013-12-07 LAB — BASIC METABOLIC PANEL
ANION GAP: 11 (ref 5–15)
BUN: 14 mg/dL (ref 6–23)
CO2: 25 meq/L (ref 19–32)
Calcium: 9 mg/dL (ref 8.4–10.5)
Chloride: 108 mEq/L (ref 96–112)
Creatinine, Ser: 0.71 mg/dL (ref 0.50–1.10)
GFR calc Af Amer: >90 mL/min (ref >90)
GFR calc non Af Amer: >90 mL/min (ref >90)
GLUCOSE: 82 mg/dL (ref 70–99)
POTASSIUM: 4 meq/L (ref 3.7–5.3)
SODIUM: 144 meq/L (ref 137–147)

## 2013-12-07 LAB — CBC WITH DIFFERENTIAL/PLATELET
BASOS ABS: 0 10*3/uL (ref 0.0–0.1)
Basophils Relative: 1 % (ref 0–1)
Eosinophils Absolute: 0 10*3/uL (ref 0.0–0.7)
Eosinophils Relative: 1 % (ref 0–5)
HCT: 35.2 % — ABNORMAL LOW (ref 36.0–46.0)
Hemoglobin: 11 g/dL — ABNORMAL LOW (ref 12.0–15.0)
LYMPHS ABS: 2 10*3/uL (ref 0.7–4.0)
LYMPHS PCT: 46 % (ref 12–46)
MCH: 26.1 pg (ref 26.0–34.0)
MCHC: 31.3 g/dL (ref 30.0–36.0)
MCV: 83.6 fL (ref 78.0–100.0)
Monocytes Absolute: 0.4 10*3/uL (ref 0.1–1.0)
Monocytes Relative: 9 % (ref 3–12)
NEUTROS ABS: 1.8 10*3/uL (ref 1.7–7.7)
NEUTROS PCT: 43 % (ref 43–77)
PLATELETS: 270 10*3/uL (ref 150–400)
RBC: 4.21 MIL/uL (ref 3.87–5.11)
RDW: 15.8 % — ABNORMAL HIGH (ref 11.5–15.5)
WBC: 4.3 10*3/uL (ref 4.0–10.5)

## 2013-12-07 LAB — POC URINE PREG, ED: Preg Test, Ur: NEGATIVE

## 2013-12-07 LAB — I-STAT TROPONIN, ED: TROPONIN I, POC: 0.01 ng/mL (ref 0.00–0.08)

## 2013-12-07 MED ORDER — FERROUS SULFATE 325 (65 FE) MG PO TABS: 325.0000 mg | ORAL_TABLET | Freq: Every day | ORAL | Status: AC

## 2013-12-07 NOTE — ED Provider Notes (Signed)
CSN: [id number]     Arrival date & time 12/07/13  1037 History   First MD Initiated Contact with Patient 12/07/13 1153     Chief Complaint  Patient presents with  . Chest Pain     (Consider location/radiation/quality/duration/timing/severity/associated sxs/prior Treatment) HPI Comments: Patient is a 38 year old female who presents the emergency department today for evaluation of palpitations. 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 She began to feel as though her heart was racing. She denies any shortness of breath. She has a mild dull ache in her chest. The sensation that her heart was racing lasted approximately 10 minutes. It resolved spontaneously. This happened to her in the past and she has seen a cardiologist. She reports that they said "they just called and heart palpitations and asked if if felt stressed". She reports that she does not have an increase in stress and does not drink caffeine. No other cardiac history. No fevers, chills, lightheadedness, dizziness, diaphoresis, nausea, vomiting.  Patient is a 38 y.o. female presenting with chest pain. The history is provided by the patient. No language interpreter was used.  Chest Pain Associated symptoms: palpitations   Associated symptoms: no abdominal pain, no diaphoresis, no fever, no nausea, no shortness of breath and not vomiting     History reviewed. No pertinent past medical history. Past Surgical History  Procedure Laterality Date  . Cystectomy     History reviewed. No pertinent family history. History  Substance Use Topics  . Smoking status: Never Smoker   . Smokeless tobacco: Not on file  . Alcohol Use: No   OB History   Grav Para Term Preterm Abortions TAB SAB Ect Mult Living                 Review of Systems  Constitutional: Negative for fever, chills and diaphoresis.  Respiratory: Negative for shortness of breath.   Cardiovascular: Positive for chest  pain and palpitations.  Gastrointestinal: Negative for nausea, vomiting and abdominal pain.  All other systems reviewed and are negative.     Allergies  Review of patient's allergies indicates no known allergies.  Home Medications   Prior to Admission medications   Medication Sig Start Date End Date Taking? Authorizing Provider  acyclovir (ZOVIRAX) 400 MG tablet Take 400 mg by mouth 2 (two) times a week.   Yes Historical Provider, MD   BP 133/84  Pulse 90  Temp(Src) 98.3 F (36.8 C) (Oral)  Resp 16  Ht 5\' 6"  (1.676 m)  Wt 119 lb (53.978 kg)  BMI 19.22 kg/m2  SpO2 100%  LMP 11/09/2013 Physical Exam  Nursing note and vitals reviewed. Constitutional: She is oriented to person, place, and time. She appears well-developed and well-nourished. No distress.  Resting comfortably in bed  HENT:  Head: Normocephalic and atraumatic.  Right Ear: External ear normal.  Left Ear: External ear normal.  Nose: Nose normal.  Mouth/Throat: Oropharynx is clear and moist.  Eyes: Conjunctivae are normal.  Neck: Normal range of motion.  Cardiovascular: Normal rate, regular rhythm, normal heart sounds, intact distal pulses and normal pulses.   Pulses:      Radial pulses are 2+ on the right side, and 2+ on the left side.       Posterior tibial pulses are 2+ on the right side, and 2+ on the left side.  No leg swelling  Pulmonary/Chest: Effort normal and breath sounds normal. No stridor. No respiratory distress.  She has no wheezes. She has no rales.  Abdominal: Soft. She exhibits no distension.  Musculoskeletal: Normal range of motion.  Neurological: She is alert and oriented to person, place, and time. She has normal strength.  Skin: Skin is warm and dry. She is not diaphoretic. No erythema.  Psychiatric: She has a normal mood and affect. Her behavior is normal.    ED Course  Procedures (including critical care time) Labs Review Labs Reviewed  CBC WITH DIFFERENTIAL - Abnormal; Notable for  the following:    Hemoglobin 11.0 (*)    HCT 35.2 (*)    RDW 15.8 (*)    All other components within normal limits  BASIC METABOLIC PANEL  I-STAT TROPOININ, ED  POC URINE PREG, ED    Imaging Review Dg Chest 2 View  12/07/2013   CLINICAL DATA:  Heart palpitations.  Chest pain.  EXAM: CHEST  2 VIEW  COMPARISON:  07/07/2003.  FINDINGS: Mediastinum hilar structures are normal. Bilateral symmetric nodular densities over the lung bases consistent with nipple shadows. Lungs are clear. No pleural effusion or pneumothorax. Heart size normal. No acute bony abnormality.  IMPRESSION: No active cardiopulmonary disease.   Electronically Signed   By: Marcello Moores  Register   On: 12/07/2013 14:20     EKG Interpretation   Date/Time:  Tuesday December 07 2013 10:45:54 EDT Ventricular Rate:  86 PR Interval:  152 QRS Duration: 83 QT Interval:  348 QTC Calculation: 416 R Axis:   83 Text Interpretation:  Sinus rhythm Probable left atrial enlargement RSR'  in V1 or V2, right VCD or RVH Baseline wander in lead(s) I III aVL V1 No  significant change since last tracing Confirmed by Winfred Leeds  MD, SAM  [phone]) on 12/07/2013 11:24:51 AM      MDM   Final diagnoses:  Palpitations    Patient presents to ED with palpitations. Palpitations have resolved without recurrence. Workup in ED is unremarkable. Patient given cardiology follow up. Discussed reasons to return to ED immediately. Vital signs stable for discharge. Dr. Winfred Leeds evaluated patient and agrees with plan. Patient / Family / Caregiver informed of clinical course, understand medical decision-making process, and agree with plan.    Elwyn Lade, PA-C 12/08/13 [phone]

## 2013-12-07 NOTE — ED Notes (Signed)
Pt calm and resting quietly.

## 2013-12-07 NOTE — Discharge Instructions (Signed)

## 2013-12-07 NOTE — ED Provider Notes (Signed)
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 She denies illicit drug use denies excess caffeine use No syncope she did have mild chest discomfort during episode continues to have mild chest discomfort presently anterior, nonradiating symptoms improving with time. She presently feels her heart is beating irregularly. On exam no distress lungs clear to auscultation heart regular rate and rhythm abdomen nondistended nontender patient is an normal sinus rhythm on cardiac monitor presently. Plan cardiology referral prescription for iron sulfate  Orlie Dakin, MD 12/07/13 1546

## 2013-12-07 NOTE — ED Notes (Signed)
Pt has hx of heart palpitations and chest pain.  Was seen by cardiology.  Pt states her heart feels like it is "racing fast".  Pt states chest pain started today.  Pt was jumping.  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  Pain started then.  Pt states pain has relaxed upon sitting.  No cough/congestion/fever.

## 2013-12-07 NOTE — ED Notes (Signed)
Pt given cup to get urine sample. Unable at this time. Will notify staff when able.

## 2013-12-08 NOTE — ED Provider Notes (Signed)
Medical screening examination/treatment/procedure(s) were conducted as a shared visit with non-physician practitioner(s) and myself.  I personally evaluated the patient during the encounter.   EKG Interpretation   Date/Time:  Tuesday December 07 2013 10:45:54 EDT Ventricular Rate:  86 PR Interval:  152 QRS Duration: 83 QT Interval:  348 QTC Calculation: 416 R Axis:   83 Text Interpretation:  Sinus rhythm Probable left atrial enlargement RSR'  in V1 or V2, right VCD or RVH Baseline wander in lead(s) I III aVL V1 No  significant change since last tracing Confirmed by Winfred Leeds  MD, Deliana Avalos  [phone]) on 12/07/2013 11:24:51 AM       Orlie Dakin, MD 12/08/13 [phone]

## 2014-01-09 NOTE — Progress Notes (Signed)
Patient ID: Pamela Holland, female   DOB: Mar 05, 1975, 38 y.o.   MRN: [id number]    38 yo seen in ER 10/27 for palpitations.  Lasted 10 minutes Sudden onset no syncope.  Had some SSCP  In ER telemetry normal .  R/O ECG ok with no arrhythmia or evidence of WPW.  No high risk family history  Labs reviewed ok except for Hb 11 No TSH done  She has had palpitations intermittently in past   Seen by [facility] cardiologist about 8 years ago  Describes flip flops and rapid beats.  Sometimes gets sharp pain on left side of chest when palpitations bad.  Denies excess ETOH, caffeine or stimulants.  Has not had palpitations since ER visit and in general symptoms not frequent enough for event monitor  Concerned about mothers history of CHF and fathers history of CAD.  Episode that she went to ER for a bit different than forceful beats that give her chest pain.  Clinically that was more likely an episode of PSVT.       ROS: Denies fever, malais, weight loss, blurry vision, decreased visual acuity, cough, sputum, SOB, hemoptysis, pleuritic pain, palpitaitons, heartburn, abdominal pain, melena, lower extremity edema, claudication, or rash.  All other systems reviewed and negative   General: Affect appropriate Healthy:  appears stated age 74: normal Neck supple with no adenopathy JVP normal no bruits no thyromegaly Lungs clear with no wheezing and good diaphragmatic motion Heart:  S1/S2 no murmur,rub, gallop or click PMI normal Abdomen: benighn, BS positve, no tenderness, no AAA no bruit.  No HSM or HJR Distal pulses intact with no bruits No edema Neuro non-focal Skin warm and dry No muscular weakness  Medications Current Outpatient Prescriptions  Medication Sig Dispense Refill  . acyclovir (ZOVIRAX) 400 MG tablet Take 400 mg by mouth 2 (two) times a week.    . ferrous sulfate 325 (65 FE) MG tablet Take 1 tablet (325 mg total) by mouth daily. 30 tablet 0   No current facility-administered medications  for this visit.    Allergies Review of patient's allergies indicates no known allergies.  Family History: No family history on file.  Social History: History   Social History  . Marital Status: Single    Spouse Name: N/A    Number of Children: N/A  . Years of Education: N/A   Occupational History  . Not on file.   Social History Main Topics  . Smoking status: Never Smoker   . Smokeless tobacco: Not on file  . Alcohol Use: No  . Drug Use: No  . Sexual Activity: Yes   Other Topics Concern  . Not on file   Social History Narrative  . No narrative on file    Past Surgical History  Procedure Laterality Date  . Cystectomy      No past medical history on file.  Electrocardiogram:  10/27  SR normal ECG no bypass tract rate 86    Assessment and Plan

## 2014-01-10 ENCOUNTER — Ambulatory Visit (INDEPENDENT_AMBULATORY_CARE_PROVIDER_SITE_OTHER): Payer: Managed Care, Other (non HMO) | Admitting: Cardiovascular Disease

## 2014-01-10 ENCOUNTER — Encounter: Payer: Self-pay | Admitting: Cardiovascular Disease

## 2014-01-10 VITALS — BP 124/70 | HR 67 | Ht 66.0 in | Wt 119.0 lb

## 2014-01-10 DIAGNOSIS — R002 Palpitations: Secondary | ICD-10-CM

## 2014-01-10 DIAGNOSIS — R072 Precordial pain: Secondary | ICD-10-CM

## 2014-01-10 DIAGNOSIS — R079 Chest pain, unspecified: Secondary | ICD-10-CM

## 2014-01-10 LAB — T4, FREE: FREE T4: 0.72 ng/dL (ref 0.60–1.60)

## 2014-01-10 MED ORDER — PROPRANOLOL HCL 10 MG PO TABS: 10.0000 mg | ORAL_TABLET | ORAL | Status: DC | PRN

## 2014-01-10 NOTE — Assessment & Plan Note (Signed)
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  PRN inderal called in Check TSH/T4  Event monitor if symptoms become more frequent

## 2014-01-10 NOTE — Patient Instructions (Signed)
Your physician recommends that you schedule a follow-up appointment in:   Violet has recommended you make the following change in your medication:  PROPANOLOL  10 MG   1 TAB DAILY   AS  NEEDED  FOR  PALPITATIONS  Your physician recommends that you return for lab work in:  TODAY   TSH   T4 Your physician has requested that you have a stress echocardiogram. For further information please visit [email or web address]. Please follow instruction sheet as given.

## 2014-01-10 NOTE — Assessment & Plan Note (Signed)
Atypical related to sense of palpitations F/U stress echo Baseline ECG ok

## 2014-01-21 ENCOUNTER — Ambulatory Visit (HOSPITAL_COMMUNITY): Payer: Managed Care, Other (non HMO) | Attending: Cardiovascular Disease

## 2014-01-21 DIAGNOSIS — R079 Chest pain, unspecified: Secondary | ICD-10-CM | POA: Insufficient documentation

## 2014-01-21 DIAGNOSIS — R002 Palpitations: Secondary | ICD-10-CM

## 2014-01-21 NOTE — Progress Notes (Signed)
Stress echocardiogram completed 01/21/2014

## 2014-03-23 NOTE — Progress Notes (Signed)
Patient ID: Pamela Holland, female   DOB: February 26, 1975, 39 y.o.   MRN: [id number]    39 y.o seen in ER 10/27 for palpitations.  Lasted 10 minutes Sudden onset no syncope.  Had some SSCP  In ER telemetry normal .  R/O ECG ok with no arrhythmia or evidence of WPW.  No high risk family history  Labs reviewed ok except for Hb 11 No TSH done  She has had palpitations intermittently in past   Seen by [facility] cardiologist about 8 years ago  Describes flip flops and rapid beats.  Sometimes gets sharp pain on left side of chest when palpitations bad.  Denies excess ETOH, caffeine or stimulants.  Has not had palpitations since ER visit and in general symptoms not frequent enough for event monitor  Concerned about mothers history of CHF and fathers history of CAD.  Episode that she went to ER for a bit different than forceful beats that give her chest pain.  Clinically that was more likely an episode of PSVT.    F/U stress echo normal 01/21/14  01/10/14  Free T4 normal     ROS: Denies fever, malais, weight loss, blurry vision, decreased visual acuity, cough, sputum, SOB, hemoptysis, pleuritic pain, palpitaitons, heartburn, abdominal pain, melena, lower extremity edema, claudication, or rash.  All other systems reviewed and negative   General: Affect appropriate Healthy:  appears stated age 83: normal Neck supple with no adenopathy JVP normal no bruits no thyromegaly Lungs clear with no wheezing and good diaphragmatic motion Heart:  S1/S2 no murmur,rub, gallop or click PMI normal Abdomen: benighn, BS positve, no tenderness, no AAA no bruit.  No HSM or HJR Distal pulses intact with no bruits No edema Neuro non-focal Skin warm and dry No muscular weakness  Medications Current Outpatient Prescriptions  Medication Sig Dispense Refill  . acyclovir (ZOVIRAX) 400 MG tablet Take 400 mg by mouth 2 (two) times a week.    . ferrous sulfate 325 (65 FE) MG tablet Take 1 tablet (325 mg total) by mouth daily.  30 tablet 0  . propranolol (INDERAL) 10 MG tablet Take 1 tablet (10 mg total) by mouth as needed. 30 tablet 3   No current facility-administered medications for this visit.    Allergies Review of patient's allergies indicates no known allergies.  Family History: Family History  Problem Relation Age of Onset  . Heart disease    . Heart attack    . Stroke    . Hypertension      Social History: History   Social History  . Marital Status: Single    Spouse Name: N/A  . Number of Children: N/A  . Years of Education: N/A   Occupational History  . Not on file.   Social History Main Topics  . Smoking status: Never Smoker   . Smokeless tobacco: Not on file  . Alcohol Use: No  . Drug Use: No  . Sexual Activity: Yes   Other Topics Concern  . Not on file   Social History Narrative    Past Surgical History  Procedure Laterality Date  . Cystectomy    . Wisdom tooth extraction    . Uterine fibroid surgery      Past Medical History  Diagnosis Date  . Palpitations     Electrocardiogram:  12/07/13  SR normal ECG no bypass tract rate 86    Assessment and Plan

## 2014-03-25 ENCOUNTER — Encounter: Payer: Managed Care, Other (non HMO) | Admitting: Cardiovascular Disease

## 2014-03-25 ENCOUNTER — Ambulatory Visit: Payer: Managed Care, Other (non HMO) | Admitting: Cardiovascular Disease

## 2014-04-12 ENCOUNTER — Encounter: Payer: Self-pay | Admitting: Cardiovascular Disease

## 2014-04-12 ENCOUNTER — Ambulatory Visit (INDEPENDENT_AMBULATORY_CARE_PROVIDER_SITE_OTHER): Payer: Managed Care, Other (non HMO) | Admitting: Cardiovascular Disease

## 2014-04-12 VITALS — BP 102/56 | HR 83 | Ht 66.0 in | Wt 118.4 lb

## 2014-04-12 DIAGNOSIS — R072 Precordial pain: Secondary | ICD-10-CM

## 2014-04-12 DIAGNOSIS — R002 Palpitations: Secondary | ICD-10-CM

## 2014-04-12 NOTE — Progress Notes (Signed)
Patient ID: Pamela Holland, female   DOB: 05-26-1975, 39 y.o.   MRN: [id number]    38 y.o.  seen in ER 10/27 for palpitations.  Lasted 10 minutes Sudden onset no syncope.  Had some SSCP  In ER telemetry normal .  R/O ECG ok with no arrhythmia or evidence of WPW.  No high risk family history  Labs reviewed ok except for Hb 11 No TSH done  She has had palpitations intermittently in past   Seen by [facility] cardiologist about 8 years ago  Describes flip flops and rapid beats.  Sometimes gets sharp pain on left side of chest when palpitations bad.  Denies excess ETOH, caffeine or stimulants.  Has not had palpitations since ER visit and in general symptoms not frequent enough for event monitor  Concerned about mothers history of CHF and fathers history of CAD.  Episode that she went to ER for a bit different than forceful beats that give her chest pain.  Clinically that was more likely an episode of PSVT.    01/21/14  Stress echo normal with no structural heart disease  Doing well with no significant palpitations    ROS: Denies fever, malais, weight loss, blurry vision, decreased visual acuity, cough, sputum, SOB, hemoptysis, pleuritic pain, palpitaitons, heartburn, abdominal pain, melena, lower extremity edema, claudication, or rash.  All other systems reviewed and negative   General: Affect appropriate Healthy:  appears stated age 8: normal Neck supple with no adenopathy JVP normal no bruits no thyromegaly Lungs clear with no wheezing and good diaphragmatic motion Heart:  S1/S2 no murmur,rub, gallop or click PMI normal Abdomen: benighn, BS positve, no tenderness, no AAA no bruit.  No HSM or HJR Distal pulses intact with no bruits No edema Neuro non-focal Skin warm and dry No muscular weakness  Medications Current Outpatient Prescriptions  Medication Sig Dispense Refill  . acyclovir (ZOVIRAX) 400 MG tablet Take 400 mg by mouth 2 (two) times a week.    . ferrous sulfate 325 (65 FE) MG  tablet Take 1 tablet (325 mg total) by mouth daily. 30 tablet 0  . propranolol (INDERAL) 10 MG tablet Take 1 tablet (10 mg total) by mouth as needed. 30 tablet 3   No current facility-administered medications for this visit.    Allergies Review of patient's allergies indicates no known allergies.  Family History: Family History  Problem Relation Age of Onset  . Heart disease    . Heart attack    . Stroke    . Hypertension      Social History: History   Social History  . Marital Status: Single    Spouse Name: N/A  . Number of Children: N/A  . Years of Education: N/A   Occupational History  . Not on file.   Social History Main Topics  . Smoking status: Never Smoker   . Smokeless tobacco: Not on file  . Alcohol Use: No  . Drug Use: No  . Sexual Activity: Yes   Other Topics Concern  . Not on file   Social History Narrative    Past Surgical History  Procedure Laterality Date  . Cystectomy    . Wisdom tooth extraction    . Uterine fibroid surgery      Past Medical History  Diagnosis Date  . Palpitations     Electrocardiogram:  10/27  SR normal ECG no bypass tract rate 86    Assessment and Plan

## 2014-04-12 NOTE — Assessment & Plan Note (Signed)
Benign no structural heart disease no significant recurrence observe Has not taken any inderal

## 2014-04-12 NOTE — Assessment & Plan Note (Signed)
Resolved Normal stress echo observe

## 2015-03-07 MED FILL — NITROFURANTOIN MONO-MCR 100: 100 | 7 days supply | Qty: 14 | Fill #0

## 2015-03-24 MED FILL — ACYCLOVIR 400 MG TABLET: 400 | 30 days supply | Qty: 60 | Fill #4

## 2015-04-13 ENCOUNTER — Telehealth: Payer: Self-pay | Admitting: Cardiovascular Disease

## 2015-04-13 ENCOUNTER — Encounter: Payer: Self-pay | Admitting: Cardiovascular Disease

## 2015-04-13 NOTE — Telephone Encounter (Signed)
New Message  This message is to inform you that we have made 3 consecutive attempts to contact the patient since 01/12/2015.  We have also mailed a letter to the patient to inform them to call in and schedule. Although we were unsuccessful in these attempts we wanted you to be aware of our efforts. Will remove the patient from our staff messages at this time.   [place] [facility]

## 2015-04-20 MED FILL — LORYNA 3-0.02 MG TAB: 3-0.02 | 28 days supply | Qty: 28 | Fill #0

## 2015-04-20 MED FILL — VALACYCLOVIR HCL 500 MG TAB: 500 | 30 days supply | Qty: 30 | Fill #0

## 2015-04-20 MED FILL — metroNIDAZOLE 500 MG TABS: 500 | 7 days supply | Qty: 14 | Fill #0

## 2015-04-25 MED FILL — NITROFURANTOIN MONO-MCR 100: 100 | 7 days supply | Qty: 14 | Fill #0

## 2015-06-02 MED FILL — LORYNA 3-0.02 MG TAB: 3-0.02 | 28 days supply | Qty: 28 | Fill #1

## 2015-06-30 MED FILL — VALACYCLOVIR HCL 500 MG TAB: 500 | 30 days supply | Qty: 30 | Fill #1

## 2015-06-30 MED FILL — LORYNA 3-0.02 MG TAB: 3-0.02 | 28 days supply | Qty: 28 | Fill #2

## 2015-07-27 MED FILL — LORYNA 3-0.02 MG TAB: 3-0.02 | 28 days supply | Qty: 28 | Fill #0

## 2015-07-28 MED FILL — VALACYCLOVIR HCL 500 MG TAB: 500 | 30 days supply | Qty: 30 | Fill #2

## 2015-08-18 MED FILL — metroNIDAZOLE 500 MG TABS: 500 | 7 days supply | Qty: 14 | Fill #0

## 2015-08-25 MED FILL — LORYNA 3-0.02 MG TAB: 3-0.02 | 28 days supply | Qty: 28 | Fill #0

## 2015-09-08 MED FILL — VALACYCLOVIR HCL 500 MG TAB: 500 | 30 days supply | Qty: 30 | Fill #3

## 2015-09-22 MED FILL — LORYNA 3-0.02 MG TAB: 3-0.02 | 28 days supply | Qty: 28 | Fill #1

## 2015-10-13 MED FILL — VALACYCLOVIR HCL 500 MG TAB: 500 | 30 days supply | Qty: 30 | Fill #4

## 2015-10-18 MED FILL — LORYNA 3-0.02 MG TAB: 3-0.02 | 28 days supply | Qty: 28 | Fill #2

## 2015-11-17 MED FILL — VALACYCLOVIR HCL 500 MG TAB: 500 | 30 days supply | Qty: 30 | Fill #5

## 2015-11-17 MED FILL — GIANVI 3-0.02 MG TABS: 3-0.02 | 28 days supply | Qty: 28 | Fill #3

## 2015-12-15 MED FILL — GIANVI 3-0.02 MG TABS: 3-0.02 | 28 days supply | Qty: 28 | Fill #4

## 2016-01-12 MED FILL — metroNIDAZOLE 500 MG TABS: 500 | 7 days supply | Qty: 14 | Fill #0

## 2016-01-12 MED FILL — GIANVI 3-0.02 MG TABS: 3-0.02 | 28 days supply | Qty: 28 | Fill #5

## 2016-02-09 MED FILL — GIANVI 3-0.02 MG TABS: 3-0.02 | 28 days supply | Qty: 28 | Fill #6

## 2016-03-11 MED FILL — SULFAMETHOXAZOLE/TMP DS TAB: 800-160 | 3 days supply | Qty: 6 | Fill #0

## 2016-03-11 MED FILL — GIANVI 3-0.02 MG TABS: 3-0.02 | 28 days supply | Qty: 28 | Fill #7

## 2016-03-22 MED FILL — metroNIDAZOLE 500 MG TABS: 500 | 7 days supply | Qty: 14 | Fill #0

## 2016-03-29 MED FILL — AZITHROMYCIN 500 MG TABLET: 500 | 1 days supply | Qty: 2 | Fill #0

## 2016-04-11 MED FILL — DROSPIR-ETH ESTRA 3/.02 MG: 3-0.02 | 28 days supply | Qty: 28 | Fill #8

## 2016-04-16 MED FILL — AZITHROMYCIN 500 MG TABLET: 500 | 1 days supply | Qty: 2 | Fill #0

## 2016-04-30 MED FILL — ACYCLOVIR 400 MG TABLET: 400 | 30 days supply | Qty: 60 | Fill #0

## 2016-05-03 MED FILL — DROSPIRENONE-ETHINYL ESTRAD: 3-0.02 | 28 days supply | Qty: 28 | Fill #9

## 2016-05-23 MED FILL — metroNIDAZOLE 500 MG TABS: 500 | 7 days supply | Qty: 14 | Fill #0

## 2016-06-03 MED FILL — DROSPIRENONE-ETHINYL ESTRAD: 3-0.02 | 28 days supply | Qty: 28 | Fill #10

## 2016-07-01 MED FILL — LORYNA 3-0.02 MG TAB: 3-0.02 | 28 days supply | Qty: 28 | Fill #11

## 2016-07-26 MED FILL — LORYNA 3-0.02 MG TAB: 3-0.02 | 28 days supply | Qty: 28 | Fill #0

## 2016-08-22 MED FILL — LORYNA 3-0.02 MG TAB: 3-0.02 | 28 days supply | Qty: 28 | Fill #1

## 2016-09-20 MED FILL — LORYNA 3-0.02 MG TAB: 3-0.02 | 28 days supply | Qty: 28 | Fill #2

## 2016-10-17 MED FILL — DROSPIR-ETH ESTRA 3/.02 MG: 3-0.02 | 28 days supply | Qty: 28 | Fill #3

## 2016-11-13 MED FILL — DROSPIR-ETH ESTRA 3/.02 MG: 3-0.02 | 28 days supply | Qty: 28 | Fill #4

## 2016-11-29 MED FILL — ACYCLOVIR 400 MG TABLET: 400 | 30 days supply | Qty: 60 | Fill #1

## 2016-12-17 MED FILL — DROSPIR-ETH ESTRA 3/.02 MG: 3-0.02 | 28 days supply | Qty: 28 | Fill #5

## 2017-01-09 MED FILL — DROSPIR-ETH ESTRA 3/.02 MG: 3-0.02 | 28 days supply | Qty: 28 | Fill #6

## 2017-02-07 MED FILL — DROSPIR-ETH ESTRA 3/.02 MG: 3-0.02 | 28 days supply | Qty: 28 | Fill #7

## 2017-03-12 MED FILL — DROSPIR-ETH ESTRA 3/.02 MG: 3-0.02 | 28 days supply | Qty: 28 | Fill #8

## 2017-04-04 MED FILL — DROSPIR-ETH ESTRA 3/.02 MG: 3-0.02 | 28 days supply | Qty: 28 | Fill #9

## 2017-04-15 ENCOUNTER — Emergency Department (HOSPITAL_COMMUNITY)
Admission: EM | Admit: 2017-04-15 | Discharge: 2017-04-15 | Disposition: A | Discharge status: Home / Self Care | Payer: 59 | Attending: Emergency Medicine | Admitting: Emergency Medicine

## 2017-04-15 ENCOUNTER — Other Ambulatory Visit: Payer: Self-pay

## 2017-04-15 ENCOUNTER — Encounter (HOSPITAL_COMMUNITY): Payer: Self-pay | Admitting: Emergency Medicine

## 2017-04-15 DIAGNOSIS — R05 Cough: Secondary | ICD-10-CM | POA: Diagnosis not present

## 2017-04-15 DIAGNOSIS — H11431 Conjunctival hyperemia, right eye: Secondary | ICD-10-CM | POA: Diagnosis present

## 2017-04-15 DIAGNOSIS — R0981 Nasal congestion: Secondary | ICD-10-CM | POA: Diagnosis not present

## 2017-04-15 DIAGNOSIS — B309 Viral conjunctivitis, unspecified: Secondary | ICD-10-CM

## 2017-04-15 DIAGNOSIS — J029 Acute pharyngitis, unspecified: Secondary | ICD-10-CM | POA: Insufficient documentation

## 2017-04-15 MED ORDER — ERYTHROMYCIN 5 MG/GM OP OINT
TOPICAL_OINTMENT | Freq: Three times a day (TID) | OPHTHALMIC | Status: DC
  Administered 2017-04-15: 1 via OPHTHALMIC
  Filled 2017-04-15: qty 3.5

## 2017-04-15 MED ORDER — ERYTHROMYCIN 5 MG/GM OP OINT: TOPICAL_OINTMENT | Freq: Four times a day (QID) | OPHTHALMIC | Status: DC

## 2017-04-15 MED ORDER — FLUORESCEIN SODIUM 1 MG OP STRP
1.0000 | ORAL_STRIP | Freq: Once | OPHTHALMIC | Status: DC
  Filled 2017-04-15: qty 1

## 2017-04-15 NOTE — ED Provider Notes (Signed)
[facility] DEPT Provider Note  CSN: [id number] Arrival date & time: 04/15/17 8185  Chief Complaint(s) Conjunctivitis  HPI Pamela Holland is a 43 y.o. female   The history is provided by the patient.  Conjunctivitis  This is a new problem. The current episode started 2 days ago. The problem occurs constantly. The problem has not changed since onset.Pertinent negatives include no chest pain, no abdominal pain, no headaches and no shortness of breath. Nothing aggravates the symptoms. Nothing relieves the symptoms. She has tried nothing for the symptoms.    Seen at UC last night and tested negative for influenza. Treated for sinusitis with Amoxicillin.  No contact use. No trauma to the eye.   Past Medical History Past Medical History:  Diagnosis Date  . Palpitations    Patient Active Problem List   Diagnosis Date Noted  . Palpitations 01/10/2014  . Chest pain 01/10/2014   Home Medication(s) Prior to Admission medications   Medication Sig Start Date End Date Taking? Authorizing Provider  acyclovir (ZOVIRAX) 400 MG tablet Take 400 mg by mouth 2 (two) times a week.    [provider]  ferrous sulfate 325 (65 FE) MG tablet Take 1 tablet (325 mg total) by mouth daily. 12/07/13   Cleatrice Burke, PA-C  propranolol (INDERAL) 10 MG tablet Take 1 tablet (10 mg total) by mouth as needed. 01/10/14   Josue Hector, MD                                                                                                                                    Past Surgical History Past Surgical History:  Procedure Laterality Date  . CYSTECTOMY    . UTERINE FIBROID SURGERY    . WISDOM TOOTH EXTRACTION     Family History Family History  Problem Relation Age of Onset  . Hypertension Mother   . Diabetes Mother   . Hypertension Father   . Heart attack Father   . Heart disease Unknown   . Heart attack Unknown   . Stroke Unknown   . Hypertension Unknown       Social History Social History   Tobacco Use  . Smoking status: Never Smoker  Substance Use Topics  . Alcohol use: No  . Drug use: No   Allergies Patient has no known allergies.  Review of Systems Review of Systems  Constitutional: Negative for fever.  HENT: Positive for congestion, rhinorrhea, sinus pressure and sore throat.   Respiratory: Positive for cough. Negative for shortness of breath.   Cardiovascular: Negative for chest pain.  Gastrointestinal: Negative for abdominal pain.  Neurological: Negative for headaches.   All other systems are reviewed and are negative for acute change except as noted in the HPI  Physical Exam Vital Signs  I have reviewed the triage vital signs BP 114/79 (BP Location: Left Arm)   Pulse (!) 101   Temp 98.9 F (  37.2 C) (Oral)   Resp 16   SpO2 96%   Physical Exam  Constitutional: She is oriented to person, place, and time. She appears well-developed and well-nourished. No distress.  HENT:  Head: Normocephalic and atraumatic.  Nose: Mucosal edema and rhinorrhea present.  Mouth/Throat: Posterior oropharyngeal erythema (mild with PND and cobblestoning) present.  Eyes: EOM are normal. Pupils are equal, round, and reactive to light. Right eye exhibits no discharge. Left eye exhibits no discharge. Right conjunctiva is injected. Right conjunctiva has no hemorrhage. Left conjunctiva is not injected. Left conjunctiva has no hemorrhage. No scleral icterus.  Slit lamp exam:      The right eye shows no corneal abrasion and no fluorescein uptake.  Neck: Normal range of motion. Neck supple.  Cardiovascular: Normal rate and regular rhythm. Exam reveals no gallop and no friction rub.  No murmur heard. Pulmonary/Chest: Effort normal and breath sounds normal. No stridor. No respiratory distress. She has no rales.  Abdominal: Soft. She exhibits no distension. There is no tenderness.  Musculoskeletal: She exhibits no edema or tenderness.  Neurological:  She is alert and oriented to person, place, and time.  Skin: Skin is warm and dry. No rash noted. She is not diaphoretic. No erythema.  Psychiatric: She has a normal mood and affect.  Vitals reviewed.   ED Results and Treatments Labs (all labs ordered are listed, but only abnormal results are displayed) Labs Reviewed - No data to display                                                                                                                       EKG  EKG Interpretation  Date/Time:    Ventricular Rate:    PR Interval:    QRS Duration:   QT Interval:    QTC Calculation:   R Axis:     Text Interpretation:        Radiology No results found. Pertinent labs & imaging results that were available during my care of the patient were reviewed by me and considered in my medical decision making (see chart for details).  Medications Ordered in ED Medications  fluorescein ophthalmic strip 1 strip (not administered)  erythromycin ophthalmic ointment (not administered)                                                                                                                                    Procedures  Procedures  (including critical care time)  Medical Decision Making / ED Course I have reviewed the nursing notes for this encounter and the patient's prior records (if available in EHR or on provided paperwork).    Conjuctivitis, right. No abrasions or ulcer. Likely viral give other URI sx, but reports purulence. Will treat with erythromycin.  The patient is safe for discharge with strict return precautions.   Final Clinical Impression(s) / ED Diagnoses Final diagnoses:  Acute viral conjunctivitis of right eye    Disposition: Discharge  Condition: Good  I have discussed the results, Dx and Tx plan with the patient who expressed understanding and agree(s) with the plan. Discharge instructions discussed at great length. The patient was given strict return precautions  who verbalized understanding of the instructions. No further questions at time of discharge.    ED Discharge Orders    None       Follow Up: Primary care provider   If you do not have a primary care physician, contact [facility] at [phone] for referral     This chart was dictated using voice recognition software.  Despite best efforts to proofread,  errors can occur which can change the documentation meaning.   Fatima Blank, MD 04/15/17 1017

## 2017-04-15 NOTE — ED Triage Notes (Signed)
Pt verbalizes ongoing sinus and cold symptoms. Pt verbalizes new having right eye redness and crusting.

## 2017-04-15 NOTE — ED Notes (Signed)
Bed: WTR5 Expected date:  Expected time:  Means of arrival:  Comments: 

## 2017-05-05 MED FILL — LORYNA 3-0.02 MG TABS: 3-0.02 | 21 days supply | Qty: 28 | Fill #0

## 2017-05-05 MED FILL — IBUPROFEN 800 MG TAB: 800 | 10 days supply | Qty: 30 | Fill #0

## 2017-05-23 MED FILL — LORYNA 3-0.02 MG TABS: 3-0.02 | 21 days supply | Qty: 28 | Fill #1

## 2017-06-11 MED FILL — LORYNA 3-0.02 MG TABS: 3-0.02 | 21 days supply | Qty: 28 | Fill #2

## 2017-07-11 MED FILL — LORYNA 3-0.02 MG TABS: 3-0.02 | 21 days supply | Qty: 28 | Fill #3

## 2017-08-01 MED FILL — LORYNA 3-0.02 MG TABS: 3-0.02 | 21 days supply | Qty: 28 | Fill #4

## 2017-08-22 MED FILL — LORYNA 3-0.02 MG TABS: 3-0.02 | 21 days supply | Qty: 28 | Fill #5

## 2017-09-18 MED FILL — LORYNA 3-0.02 MG TABS: 3-0.02 | 21 days supply | Qty: 28 | Fill #6

## 2017-10-14 ENCOUNTER — Ambulatory Visit (HOSPITAL_COMMUNITY)
Admission: RE | Admit: 2017-10-14 | Discharge: 2017-10-14 | Disposition: A | Discharge status: Home / Self Care | Payer: 59 | Source: Ambulatory Visit | Attending: Internal Medicine | Admitting: Internal Medicine

## 2017-10-14 ENCOUNTER — Ambulatory Visit: Payer: 59 | Admitting: Internal Medicine

## 2017-10-14 ENCOUNTER — Other Ambulatory Visit: Payer: Self-pay

## 2017-10-14 ENCOUNTER — Encounter: Payer: Self-pay | Admitting: Internal Medicine

## 2017-10-14 VITALS — BP 110/69 | HR 71 | Temp 98.7°F | Ht 66.0 in | Wt 120.6 lb

## 2017-10-14 DIAGNOSIS — Z8249 Family history of ischemic heart disease and other diseases of the circulatory system: Secondary | ICD-10-CM

## 2017-10-14 DIAGNOSIS — R42 Dizziness and giddiness: Secondary | ICD-10-CM

## 2017-10-14 DIAGNOSIS — F419 Anxiety disorder, unspecified: Secondary | ICD-10-CM | POA: Diagnosis not present

## 2017-10-14 DIAGNOSIS — R002 Palpitations: Secondary | ICD-10-CM | POA: Insufficient documentation

## 2017-10-14 LAB — CBC
HEMATOCRIT: 42 % (ref 36.0–46.0)
HEMOGLOBIN: 13.1 g/dL (ref 12.0–15.0)
MCH: 28.3 pg (ref 26.0–34.0)
MCHC: 31.2 g/dL (ref 30.0–36.0)
MCV: 90.7 fL (ref 78.0–100.0)
Platelets: 266 10*3/uL (ref 150–400)
RBC: 4.63 MIL/uL (ref 3.87–5.11)
RDW: 13.4 % (ref 11.5–15.5)
WBC: 4.4 10*3/uL (ref 4.0–10.5)

## 2017-10-14 NOTE — Progress Notes (Signed)
CC: Establish care, lightheadedness and irregular heartbeat  HPI: Ms. Crayton is a 42 y/o female with no past medical Hx except palpitation, presented with lightheadedness and recurrence of palpitation since 3 weeks ago. She Reports emotional stress in past couple of weeks. Mentions that since August, she has had episodes of palpitation sometimes associated with irregular heartbeat. It can happen any time,at rest or with activity, lasts for up to few minutes. Denies any chest pain, syncope or presyncope. 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 (lightheadedness does not occure at the same time of palpitation) It occures only at work and when walking or standing. Does not have any problem at home or when she gets up of bed.  No Hx of major bleeding except minor menorrhagia in past few weeks, No GI bleeeding or hematuria. She has started OTC iron last week. (She endorses she was seen by her OB GYN doctor on March and recommended to take OTC iron by her but she did not. Her last CBC is for 2015 though) She eats well, has no heat intolerance, weight or appetite change.  She correlates her symptoms with her new emotional situation and anxiety recently. How ever denies any depressed mood, loss of interest, major sleep problem or suicidal ideation or efforts. Patients had palpitation before, had an ED visit for that 2 years ago and followd up by a cardiologist. She underwent stress echo that did not show an abnormality. She prescribed a PRN Indral for her palpitation but did not take that at all.    Past Medical History:  Diagnosis Date  . 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 No orthostatic change Physical Exam  Constitutional: She is oriented to person, place, and time. She appears well-developed and well-nourished. No distress.  HENT:  Head: Normocephalic and atraumatic.  Eyes: Conjunctivae are normal.  Neck: No thyromegaly present.  Cardiovascular: Normal rate, regular rhythm and normal heart sounds. Exam reveals no gallop and no friction rub.  No murmur heard. Pulmonary/Chest: Effort normal and breath sounds normal. No respiratory distress.  Abdominal: Soft. Bowel sounds are normal. She exhibits no distension. There is no tenderness. There is no rebound.  Lymphadenopathy:    She has no cervical adenopathy.  Neurological: She is alert and oriented to person, place, and time. No cranial nerve deficit.  Skin: Skin is warm and dry. Capillary refill takes less than 2 seconds. She is not diaphoretic. No pallor.     Assessment & Plan:   See Encounters Tab for problem based charting. 42 y/o female presented with re-occurance of palpitation. As well as lightheadedness. With new emotional stress and anxiety Patient seen with Dr. Eppie Gibson   1-Palpitation Acute on chronic complaints. Occurs almost once a day since August. Lasts for couple of minutes each time. No C.P, SOB, signs and symptoms of hyper thyroidism.  Last Cardiology visit with Dr. Johnsie Cancel at 2015: Stress  echo performed and was Normal and patient started with ferrous sulfate, and propranolol 10 mg PRN (Did not take it). She did not follow up since 2016 with cardiologist. Denies caffeine use. Endorses recent emotional stress and anxiety that initiated her symptoms. On exam, vital signs are stable and she has normal rate and rhythm.   Last CBC ot 2015: Hb 11 Free T4 at 2015 : 0.7 (No TSH done)  -EKG -->Unchanged compared to previous EKG at 2015 -TSH, FT4 -CBC -Will consider Holter monitoring in future if   Needed -Follow up in clinic in 46months  2-Lightheadedness: In last 2-3 weeks. Happens at work. No dizziness, vertigo, syncope or presyncope with that. No pallor on exam. Can be due to orthostatic hypotension or anemia  -Orthostatic vital sign--> No orthostatic changes  -CBC   3- Anxiety and emotional stress recently: patient is seeing therapist for that. Denies any suicidal ideation, major sleep problems, lack of interest in activities.  She thinks it has been initiated her symptoms. We discussed with her that it can be the case but we should rule out other causes. We disscussed availability of medications for anxiety if therapy could not help. She agrees with continuing her therapy and decide for further treatment if necessary.  -Continue therapy -Follow up in clinic in 3 months or if worsening of symptoms

## 2017-10-14 NOTE — Patient Instructions (Signed)
Ms. Pamela Holland, it was our pleasure to visit you in our clinic today. You were seen due to palpitation and lightheadedness. Your vital signs and exam are normal today and EKG is unchanged comparing to your last at 2015. We will do a blood test to check your hemoglobin and thyroid hormone today and will contact you with results. Please follow up in clinic in 3 months or as needed any time, and contact us if you have any question.  Thanks, Dr. Linna Hoff, MD

## 2017-10-15 ENCOUNTER — Telehealth: Payer: Self-pay | Admitting: Internal Medicine

## 2017-10-15 LAB — T4, FREE: FREE T4: 1.18 ng/dL (ref 0.82–1.77)

## 2017-10-15 LAB — TSH: TSH: 2.27 u[IU]/mL (ref 0.450–4.500)

## 2017-10-15 MED FILL — LORYNA 3-0.02 MG TABS: 3-0.02 | 21 days supply | Qty: 28 | Fill #7

## 2017-10-15 NOTE — Progress Notes (Signed)
I saw and evaluated the patient.  I personally confirmed the key portions of Dr. Masoudi's history and exam and reviewed pertinent patient test results.  The assessment, diagnosis, and plan were formulated together and I agree with the documentation in the resident's note. 

## 2017-10-15 NOTE — Telephone Encounter (Signed)
Called and talked to the patient regarding her normal CBC and TSH, Free T4 level. She feels better. Recommended to return to clinic if not improved. Dr. Linna Hoff 09.04.2019

## 2017-11-14 MED FILL — IBUPROFEN 800 MG TAB: 800 | 10 days supply | Qty: 30 | Fill #1

## 2017-11-14 MED FILL — LORYNA 3-0.02 MG TABS: 3-0.02 | 21 days supply | Qty: 28 | Fill #8

## 2017-12-12 MED FILL — LORYNA 3-0.02 MG TABS: 3-0.02 | 28 days supply | Qty: 28 | Fill #9

## 2018-01-06 MED FILL — LORYNA 3-0.02 MG TABS: 3-0.02 | 28 days supply | Qty: 28 | Fill #10

## 2018-01-21 MED FILL — ACYCLOVIR 400 MG TABLET: 400 | 30 days supply | Qty: 60 | Fill #0

## 2018-01-30 MED FILL — LORYNA 3-0.02 MG TABS: 3-0.02 | 28 days supply | Qty: 28 | Fill #11

## 2018-03-06 MED FILL — LORYNA 3-0.02 MG TABS: 3-0.02 | 28 days supply | Qty: 28 | Fill #12

## 2018-04-01 MED FILL — LORYNA 3-0.02 MG TABS: 3-0.02 | 28 days supply | Qty: 28 | Fill #13

## 2018-04-07 ENCOUNTER — Encounter: Payer: Self-pay | Admitting: Internal Medicine

## 2018-04-30 MED FILL — DROSPIR-ETH ESTRA 3/.02 MG: 3-0.02 | 28 days supply | Qty: 28 | Fill #14

## 2018-05-01 MED FILL — NITROFURANTOIN MONO-MCR 100: 100 | 7 days supply | Qty: 14 | Fill #0

## 2018-05-27 MED FILL — ACYCLOVIR 400 MG TABLET: 400 | 30 days supply | Qty: 60 | Fill #1

## 2018-05-29 MED FILL — DROSPIR-ETH ESTRA 3/.02 MG: 3-0.02 | 21 days supply | Qty: 28 | Fill #0

## 2018-06-30 MED FILL — DROSPIR-ETH ESTRA 3/.02 MG: 3-0.02 | 21 days supply | Qty: 28 | Fill #1

## 2018-07-23 MED FILL — DROSPIR-ETH ESTRA 3/.02 MG: 3-0.02 | 21 days supply | Qty: 28 | Fill #2

## 2018-08-18 MED FILL — CIPROFLOXACIN HCL 500 MG TA: 500 | 5 days supply | Qty: 10 | Fill #0

## 2018-08-18 MED FILL — PHENAZOPYRIDINE 200 MG TAB: 200 | 2 days supply | Qty: 6 | Fill #0

## 2018-08-24 MED FILL — ACYCLOVIR 400 MG TABLET: 400 | 30 days supply | Qty: 60 | Fill #2

## 2018-08-24 MED FILL — DROSPIR-ETH ESTRA 3/.02 MG: 3-0.02 | 21 days supply | Qty: 28 | Fill #0

## 2018-09-05 MED FILL — VIT D2 1.25 MG (50,000 UNIT: 1.25 MG | 28 days supply | Qty: 4 | Fill #0

## 2018-09-18 MED FILL — DROSPIR-ETH ESTRA 3/.02 MG: 3-0.02 | 21 days supply | Qty: 28 | Fill #1

## 2018-10-05 MED FILL — VIT D2 1.25 MG (50,000 UNIT: 1.25 MG | 28 days supply | Qty: 4 | Fill #1

## 2018-10-05 MED FILL — ACYCLOVIR 400 MG TABLET: 400 | 30 days supply | Qty: 60 | Fill #0

## 2018-10-05 MED FILL — DROSPIR-ETH ESTRA 3/.02 MG: 3-0.02 | 28 days supply | Qty: 28 | Fill #0

## 2018-10-05 MED FILL — ACYCLOVIR 200 MG CAP: 200 | 5 days supply | Qty: 25 | Fill #0

## 2018-10-09 MED FILL — FLUCONAZOLE 150 MG TABS: 150 | 4 days supply | Qty: 2 | Fill #0

## 2018-10-13 MED FILL — NITROFURANTOIN MONO-MCR 100: 100 | 7 days supply | Qty: 14 | Fill #0

## 2018-10-16 MED FILL — NITROFURANTOIN MACROCRYSTAL: 50 | 30 days supply | Qty: 30 | Fill #0

## 2018-10-30 MED FILL — NITROFURANTOIN MACROCRYSTAL: 50 | 30 days supply | Qty: 30 | Fill #0

## 2018-11-03 MED FILL — VIT D2 1.25 MG (50,000 UNIT: 1.25 MG | 28 days supply | Qty: 4 | Fill #2

## 2018-11-17 MED FILL — DROSPIR-ETH ESTRA 3/.02 MG: 3-0.02 | 28 days supply | Qty: 28 | Fill #1

## 2018-12-11 MED FILL — IBUPROFEN 800 MG TAB: 800 | 10 days supply | Qty: 30 | Fill #0

## 2018-12-11 MED FILL — VIT D2 1.25 MG (50,000 UNIT: 1.25 MG | 28 days supply | Qty: 4 | Fill #3

## 2019-01-05 MED FILL — VIT D2 1.25 MG (50,000 UNIT: 1.25 MG | 28 days supply | Qty: 4 | Fill #4

## 2019-02-03 MED FILL — VIT D2 1.25 MG (50,000 UNIT: 1.25 MG | 28 days supply | Qty: 4 | Fill #5

## 2019-03-17 MED FILL — SYNTHROID 25 MCG TABLET: 25 | 30 days supply | Qty: 30 | Fill #0

## 2019-04-06 MED FILL — ACYCLOVIR 400 MG TABLET: 400 | 30 days supply | Qty: 60 | Fill #1

## 2019-05-15 ENCOUNTER — Ambulatory Visit: Payer: 59

## 2019-06-21 ENCOUNTER — Ambulatory Visit: Payer: 59 | Attending: Internal Medicine

## 2019-06-21 DIAGNOSIS — Z20822 Contact with and (suspected) exposure to covid-19: Secondary | ICD-10-CM

## 2019-06-22 LAB — NOVEL CORONAVIRUS, NAA: SARS-CoV-2, NAA: NOT DETECTED

## 2019-06-22 LAB — SARS-COV-2, NAA 2 DAY TAT

## 2019-06-29 MED FILL — FLUCONAZOLE 200 MG TAB: 200 | 1 days supply | Qty: 1 | Fill #0

## 2019-06-29 MED FILL — AMPICILLIN TR 500 MG CAP: 500 | 30 days supply | Qty: 30 | Fill #0

## 2019-06-30 ENCOUNTER — Ambulatory Visit: Payer: 59 | Attending: Internal Medicine

## 2019-06-30 DIAGNOSIS — Z20822 Contact with and (suspected) exposure to covid-19: Secondary | ICD-10-CM

## 2019-06-30 MED FILL — metroNIDAZOLE 0.75 % CREA: 0.75 | 30 days supply | Qty: 45 | Fill #0

## 2019-07-01 LAB — SARS-COV-2, NAA 2 DAY TAT

## 2019-07-01 LAB — NOVEL CORONAVIRUS, NAA: SARS-CoV-2, NAA: NOT DETECTED

## 2019-07-03 ENCOUNTER — Ambulatory Visit: Payer: 59 | Attending: Internal Medicine

## 2019-07-03 DIAGNOSIS — Z23 Encounter for immunization: Secondary | ICD-10-CM

## 2019-07-03 NOTE — Progress Notes (Signed)
   Covid-19 Vaccination Clinic  Name:  Pamela Holland    MRN: [id number] DOB: 1975/11/27  07/03/2019  [name] was observed post Covid-19 immunization for 15 minutes without incident. She was provided with Vaccine Information Sheet and instruction to access the V-Safe system.   [name] was instructed to call 911 with any severe reactions post vaccine: Marland Kitchen Difficulty breathing  . Swelling of face and throat  . A fast heartbeat  . A bad rash all over body  . Dizziness and weakness   Immunizations Administered    Name Date Dose VIS Date Route   Pfizer COVID-19 Vaccine 07/03/2019 10:55 AM 0.3 mL 04/07/2018 Intramuscular   Manufacturer: [place]   Lot: [id number]   [place]: [id number]

## 2019-07-26 ENCOUNTER — Ambulatory Visit: Payer: 59 | Attending: Internal Medicine

## 2019-07-26 DIAGNOSIS — Z23 Encounter for immunization: Secondary | ICD-10-CM

## 2019-07-26 NOTE — Progress Notes (Signed)
   Covid-19 Vaccination Clinic  Name:  Pamela Holland    MRN: [id number] DOB: December 22, 1975  07/26/2019  Pamela Holland was observed post Covid-19 immunization for 15 minutes without incident. She was provided with Vaccine Information Sheet and instruction to access the V-Safe system.   Pamela Holland was instructed to call 911 with any severe reactions post vaccine: Marland Kitchen Difficulty breathing  . Swelling of face and throat  . A fast heartbeat  . A bad rash all over body  . Dizziness and weakness   Immunizations Administered    Name Date Dose VIS Date Route   Pfizer COVID-19 Vaccine 07/26/2019 12:17 PM 0.3 mL 04/07/2018 Intramuscular   Manufacturer: [place]   Lot: [id number]   [place]: [address]-6

## 2019-07-29 MED FILL — AMPICILLIN TR 500 MG CAP: 500 | 30 days supply | Qty: 30 | Fill #1

## 2019-08-09 MED FILL — FLUCONAZOLE 150 MG TABS: 150 | 1 days supply | Qty: 1 | Fill #0

## 2019-08-24 ENCOUNTER — Encounter (INDEPENDENT_AMBULATORY_CARE_PROVIDER_SITE_OTHER): Payer: Self-pay | Admitting: Otolaryngology

## 2019-08-24 ENCOUNTER — Ambulatory Visit (INDEPENDENT_AMBULATORY_CARE_PROVIDER_SITE_OTHER): Payer: 59 | Admitting: Otolaryngology

## 2019-08-24 ENCOUNTER — Other Ambulatory Visit: Payer: Self-pay

## 2019-08-24 VITALS — Temp 97.3°F

## 2019-08-24 DIAGNOSIS — D101 Benign neoplasm of tongue: Secondary | ICD-10-CM | POA: Diagnosis not present

## 2019-08-24 NOTE — Progress Notes (Signed)
HPI: Pamela Holland is a 44 y.o. female who presents is referred by Dr. Cletis Media at [facility] for evaluation of tongue lesions.  Patient has recently noted some pigmented lesions on her tongue and was seen by her dentist who was not sure what they were and referred her to oral surgery.  She denies any symptoms related to the tongue lesions but notices dark pigmented lesions on either side of her tongue.  She denies any pain associated with these.Marland Kitchen  Past Medical History:  Diagnosis Date   Palpitations    Past Surgical History:  Procedure Laterality Date   CYSTECTOMY     UTERINE FIBROID SURGERY     WISDOM TOOTH EXTRACTION     Social History   Socioeconomic History   Marital status: Single    Spouse name: Not on file   Number of children: Not on file   Years of education: Not on file   Highest education level: Not on file  Occupational History   Not on file  Tobacco Use   Smoking status: Never Smoker   Smokeless tobacco: Never Used  Vaping Use   Vaping Use: Never used  Substance and Sexual Activity   Alcohol use: No   Drug use: No   Sexual activity: Yes  Other Topics Concern   Not on file  Social History Narrative   Not on file   Social Determinants of Health   Financial Resource Strain:    Difficulty of Paying Living Expenses:   Food Insecurity:    Worried About Charity fundraiser in the Last Year:    Arboriculturist in the Last Year:   Transportation Needs:    Film/video editor (Medical):    Lack of Transportation (Non-Medical):   Physical Activity:    Days of Exercise per Week:    Minutes of Exercise per Session:   Stress:    Feeling of Stress :   Social Connections:    Frequency of Communication with Friends and Family:    Frequency of Social Gatherings with Friends and Family:    Attends Religious Services:    Active Member of Clubs or Organizations:    Attends Music therapist:    Marital  Status:    Family History  Problem Relation Age of Onset   Hypertension Mother    Diabetes Mother    Hypertension Father    Heart attack Father    Heart disease Unknown    Heart attack Unknown    Stroke Unknown    Hypertension Unknown    No Known Allergies Prior to Admission medications   Medication Sig Start Date End Date Taking? Authorizing Provider  ferrous sulfate 325 (65 FE) MG tablet Take 1 tablet (325 mg total) by mouth daily. Patient not taking: Reported on 08/24/2019 12/07/13   Cleatrice Burke, PA-C  propranolol (INDERAL) 10 MG tablet Take 1 tablet (10 mg total) by mouth as needed. Patient not taking: Reported on 08/24/2019 01/10/14   Josue Hector, MD     Positive ROS: Otherwise negative  All other systems have been reviewed and were otherwise negative with the exception of those mentioned in the HPI and as above.  Physical Exam: Constitutional: Alert, well-appearing, no acute distress Ears: External ears without lesions or tenderness. Ear canals are clear bilaterally with intact, clear TMs.  Nasal: External nose without lesions.. Clear nasal passages Oral: Lips and gums without lesions. Tongue is normal in function.  On examination of the  dorsum of the tongue she has some dark pigmented areas on both sides of the tongue measuring 1 to 2 cm in size and are oblong.  They are soft to palpation with normal appearing mucosa and papilla otherwise.  Remaining palatal mucosal buccal mucosal and posterior oropharyngeal mucosa is pink and normal. Neck: No palpable adenopathy or masses. Respiratory: Breathing comfortably  Skin: No facial/neck lesions or rash noted.  Procedures  Assessment: Findings are consistent with benign pigmented lesion.  Plan: Reassured patient of benign pigmented lesion which may represent racial pigmentation versus a melanocytic nevus versus other benign pigmented lesions.  No further therapy or work-up is necessary.   Radene Journey,  MD   CC:

## 2019-09-13 MED FILL — SULFAMETHOXAZOLE-TMP SS TAB: 400-80 | 30 days supply | Qty: 30 | Fill #0

## 2019-10-22 ENCOUNTER — Other Ambulatory Visit: Payer: 59

## 2019-10-22 ENCOUNTER — Other Ambulatory Visit: Payer: Self-pay

## 2019-10-22 DIAGNOSIS — Z20822 Contact with and (suspected) exposure to covid-19: Secondary | ICD-10-CM

## 2019-10-24 ENCOUNTER — Encounter (HOSPITAL_COMMUNITY): Payer: Self-pay | Admitting: Emergency Medicine

## 2019-10-24 ENCOUNTER — Other Ambulatory Visit: Payer: Self-pay

## 2019-10-24 ENCOUNTER — Ambulatory Visit (HOSPITAL_COMMUNITY)
Admission: EM | Admit: 2019-10-24 | Discharge: 2019-10-24 | Disposition: A | Discharge status: Home / Self Care | Payer: 59 | Attending: Urgent Care | Admitting: Urgent Care

## 2019-10-24 DIAGNOSIS — J029 Acute pharyngitis, unspecified: Secondary | ICD-10-CM | POA: Diagnosis not present

## 2019-10-24 LAB — POCT RAPID STREP A, ED / UC: Streptococcus, Group A Screen (Direct): NEGATIVE

## 2019-10-24 MED ORDER — LIDOCAINE VISCOUS HCL 2 % MT SOLN
5.0000 mL | OROMUCOSAL | 0 refills | Status: DC | PRN
Start: 2019-10-24 — End: 2022-07-02

## 2019-10-24 NOTE — ED Triage Notes (Signed)
Pt c/o sore throat with burning onset Wednesday. Pt states she has been taking ibuprofen with some relief.

## 2019-10-24 NOTE — ED Provider Notes (Signed)
[facility]    CSN: [id number] Arrival date & time: 10/24/19  1001      History   Chief Complaint Chief Complaint  Patient presents with  . Sore Throat    HPI Pamela Holland is a 44 y.o. female.   Sore scratchy throat and productive cough x 4 days. Also some neck soreness now. Denies fever, chills, sweats, wheezing, SOB, N/V/D, dysphagia. Taking ibuprofen with mild temporary relief. Has already been COVID tested, awaiting those reseults. Works at a daycare so numerous sick contacts.      Past Medical History:  Diagnosis Date  . Palpitations     Patient Active Problem List   Diagnosis Date Noted  . Palpitations 01/10/2014  . Chest pain 01/10/2014    Past Surgical History:  Procedure Laterality Date  . CYSTECTOMY    . UTERINE FIBROID SURGERY    . WISDOM TOOTH EXTRACTION      OB History   No obstetric history on file.      Home Medications    Prior to Admission medications   Medication Sig Start Date End Date Taking? Authorizing Provider  ferrous sulfate 325 (65 FE) MG tablet Take 1 tablet (325 mg total) by mouth daily. Patient not taking: Reported on 08/24/2019 12/07/13   Cleatrice Burke, PA-C  lidocaine (XYLOCAINE) 2 % solution Use as directed 5 mLs in the mouth or throat as needed for mouth pain. 10/24/19   Volney American, PA-C  propranolol (INDERAL) 10 MG tablet Take 1 tablet (10 mg total) by mouth as needed. Patient not taking: Reported on 08/24/2019 01/10/14   Josue Hector, MD    Family History Family History  Problem Relation Age of Onset  . Hypertension Mother   . Diabetes Mother   . Hypertension Father   . Heart attack Father   . Heart disease Other   . Heart attack Other   . Stroke Other   . Hypertension Other     Social History Social History   Tobacco Use  . Smoking status: Never Smoker  . Smokeless tobacco: Never Used  Vaping Use  . Vaping Use: Never used  Substance Use Topics  . Alcohol use: No  . Drug  use: No     Allergies   Patient has no known allergies.   Review of Systems Review of Systems PER HPI    Physical Exam Triage Vital Signs ED Triage Vitals  Enc Vitals Group     BP 10/24/19 1030 119/63     Pulse Rate 10/24/19 1030 72     Resp 10/24/19 1030 15     Temp 10/24/19 1030 99 F (37.2 C)     Temp Source 10/24/19 1030 Oral     SpO2 10/24/19 1030 100 %     Weight --      Height --      Head Circumference --      Peak Flow --      Pain Score 10/24/19 1031 5     Pain Loc --      Pain Edu? --      Excl. in [place]? --    No data found.  Updated Vital Signs BP 119/63 (BP Location: Left Arm)   Pulse 72   Temp 99 F (37.2 C) (Oral)   Resp 15   SpO2 100%   Visual Acuity Right Eye Distance:   Left Eye Distance:   Bilateral Distance:    Right Eye Near:   Left Eye  Near:    Bilateral Near:     Physical Exam Vitals and nursing note reviewed.  Constitutional:      Appearance: Normal appearance. She is not ill-appearing.  HENT:     Head: Atraumatic.     Right Ear: Tympanic membrane normal.     Left Ear: Tympanic membrane normal.     Nose: Nose normal. No congestion.     Mouth/Throat:     Mouth: Mucous membranes are moist.     Pharynx: Oropharynx is clear. Posterior oropharyngeal erythema (diffuse erythema and mild tonsillar edema b/l) present. No oropharyngeal exudate.  Eyes:     Extraocular Movements: Extraocular movements intact.     Conjunctiva/sclera: Conjunctivae normal.  Cardiovascular:     Rate and Rhythm: Normal rate and regular rhythm.     Heart sounds: Normal heart sounds.  Pulmonary:     Effort: Pulmonary effort is normal.     Breath sounds: Normal breath sounds. No wheezing or rales.  Abdominal:     General: Bowel sounds are normal. There is no distension.     Palpations: Abdomen is soft.     Tenderness: There is no abdominal tenderness. There is no guarding.  Musculoskeletal:        General: Normal range of motion.     Cervical back:  Normal range of motion and neck supple.  Lymphadenopathy:     Cervical: Cervical adenopathy (focal, b/l) present.  Skin:    General: Skin is warm and dry.  Neurological:     Mental Status: She is alert and oriented to person, place, and time.  Psychiatric:        Mood and Affect: Mood normal.        Thought Content: Thought content normal.        Judgment: Judgment normal.      UC Treatments / Results  Labs (all labs ordered are listed, but only abnormal results are displayed) Labs Reviewed  CULTURE, GROUP A STREP [facility])  POCT RAPID STREP A, ED / UC    EKG   Radiology No results found.  Procedures Procedures (including critical care time)  Medications Ordered in UC Medications - No data to display  Initial Impression / Assessment and Plan / UC Course  I have reviewed the triage vital signs and the nursing notes.  Pertinent labs & imaging results that were available during my care of the patient were reviewed by me and considered in my medical decision making (see chart for details).     COVID test pending through work, rapid strep neg and throat culture pending. WIll hold from returning to work until these results are back and her sxs resolve, note given. Viscous lidocaine for comfort, OTC supportive medications and home care reviewed. Return if sxs worsening and not improving.    Final Clinical Impressions(s) / UC Diagnoses   Final diagnoses:  Pharyngitis, unspecified etiology   Discharge Instructions   None    ED Prescriptions    Medication Sig Dispense Auth. Provider   lidocaine (XYLOCAINE) 2 % solution Use as directed 5 mLs in the mouth or throat as needed for mouth pain. 100 mL Volney American, [place]     PDMP not reviewed this encounter.   Volney American, [place] 10/24/19 1128

## 2019-10-25 LAB — NOVEL CORONAVIRUS, NAA: SARS-CoV-2, NAA: NOT DETECTED

## 2019-10-26 LAB — CULTURE, GROUP A STREP (THRC)

## 2019-11-24 MED FILL — IBUPROFEN 800 MG TAB: 800 | 10 days supply | Qty: 30 | Fill #1

## 2020-01-04 ENCOUNTER — Other Ambulatory Visit (HOSPITAL_COMMUNITY): Payer: Self-pay | Admitting: Endocrinology

## 2020-01-04 MED FILL — tiZANidine HCL 4 MG TABS: 4 | 30 days supply | Qty: 90 | Fill #0

## 2020-01-04 MED FILL — MELOXICAM 15 MG TABLET: 15 | 14 days supply | Qty: 14 | Fill #0

## 2020-02-15 ENCOUNTER — Other Ambulatory Visit (HOSPITAL_COMMUNITY): Payer: Self-pay | Admitting: Medical

## 2020-02-15 MED FILL — metroNIDAZOLE 0.75 % GEL: 0.75 | 30 days supply | Qty: 45 | Fill #0

## 2020-06-07 ENCOUNTER — Other Ambulatory Visit (HOSPITAL_COMMUNITY): Payer: Self-pay

## 2020-06-07 MED ORDER — IBUPROFEN 800 MG PO TABS
800.0000 mg | ORAL_TABLET | Freq: Three times a day (TID) | ORAL | 0 refills | Status: DC | PRN
  Filled 2020-06-07: qty 30, 10d supply, fill #0

## 2020-06-23 ENCOUNTER — Other Ambulatory Visit (HOSPITAL_COMMUNITY): Payer: Self-pay

## 2020-06-23 MED ORDER — ACYCLOVIR 200 MG PO CAPS
ORAL_CAPSULE | ORAL | 3 refills | Status: DC
  Filled 2020-06-23 – 2020-06-24 (×2): qty 25, 5d supply, fill #0

## 2020-06-24 ENCOUNTER — Other Ambulatory Visit (HOSPITAL_COMMUNITY): Payer: Self-pay

## 2020-06-27 ENCOUNTER — Other Ambulatory Visit (HOSPITAL_COMMUNITY): Payer: Self-pay

## 2020-09-22 ENCOUNTER — Other Ambulatory Visit (HOSPITAL_COMMUNITY): Payer: Self-pay

## 2020-09-22 MED ORDER — METRONIDAZOLE 500 MG PO TABS
ORAL_TABLET | ORAL | 0 refills | Status: DC
  Filled 2020-09-22: qty 14, 7d supply, fill #0

## 2020-09-28 ENCOUNTER — Other Ambulatory Visit (HOSPITAL_COMMUNITY): Payer: Self-pay

## 2020-09-28 MED ORDER — METRONIDAZOLE 0.75 % VA GEL
VAGINAL | 0 refills | Status: DC
  Filled 2020-09-28: qty 70, 5d supply, fill #0

## 2020-10-24 ENCOUNTER — Other Ambulatory Visit (HOSPITAL_COMMUNITY): Payer: Self-pay

## 2020-11-24 ENCOUNTER — Other Ambulatory Visit (HOSPITAL_COMMUNITY): Payer: Self-pay

## 2020-11-24 MED ORDER — SULFAMETHOXAZOLE-TRIMETHOPRIM 400-80 MG PO TABS
ORAL_TABLET | ORAL | 2 refills | Status: DC
Start: 2020-11-24 — End: 2022-05-14
  Filled 2020-11-24: qty 30, 30d supply, fill #0
  Filled 2021-09-16: qty 30, 30d supply, fill #1

## 2020-11-24 MED ORDER — ACYCLOVIR 200 MG PO CAPS
ORAL_CAPSULE | ORAL | 3 refills | Status: DC
  Filled 2020-11-24: qty 25, 5d supply, fill #0
  Filled 2021-03-04: qty 25, 5d supply, fill #1
  Filled 2021-04-30: qty 25, 5d supply, fill #2
  Filled 2021-07-04: qty 25, 5d supply, fill #3

## 2021-01-23 ENCOUNTER — Other Ambulatory Visit (HOSPITAL_COMMUNITY): Payer: Self-pay

## 2021-01-23 MED ORDER — TRIAMCINOLONE ACETONIDE 0.1 % EX CREA
TOPICAL_CREAM | CUTANEOUS | 0 refills | Status: DC
  Filled 2021-01-23: qty 30, 10d supply, fill #0

## 2021-01-24 ENCOUNTER — Other Ambulatory Visit (HOSPITAL_COMMUNITY): Payer: Self-pay

## 2021-02-15 DIAGNOSIS — Z0189 Encounter for other specified special examinations: Secondary | ICD-10-CM | POA: Diagnosis not present

## 2021-03-05 ENCOUNTER — Other Ambulatory Visit (HOSPITAL_COMMUNITY): Payer: Self-pay

## 2021-03-06 ENCOUNTER — Encounter (HOSPITAL_BASED_OUTPATIENT_CLINIC_OR_DEPARTMENT_OTHER): Payer: Self-pay | Admitting: Obstetrics and Gynecology

## 2021-03-06 ENCOUNTER — Encounter (HOSPITAL_COMMUNITY)
Admission: RE | Admit: 2021-03-06 | Discharge: 2021-03-06 | Disposition: A | Discharge status: Home / Self Care | Payer: 59 | Source: Ambulatory Visit | Attending: Obstetrics and Gynecology | Admitting: Obstetrics and Gynecology

## 2021-03-06 ENCOUNTER — Other Ambulatory Visit: Payer: Self-pay

## 2021-03-06 DIAGNOSIS — Z01818 Encounter for other preprocedural examination: Secondary | ICD-10-CM

## 2021-03-06 DIAGNOSIS — Z01812 Encounter for preprocedural laboratory examination: Secondary | ICD-10-CM | POA: Diagnosis not present

## 2021-03-06 LAB — BASIC METABOLIC PANEL
Anion gap: 8 (ref 5–15)
BUN: 12 mg/dL (ref 6–20)
CO2: 22 mmol/L (ref 22–32)
Calcium: 8.9 mg/dL (ref 8.9–10.3)
Chloride: 107 mmol/L (ref 98–111)
Creatinine, Ser: 0.7 mg/dL (ref 0.44–1.00)
GFR, Estimated: >60 mL/min (ref >60)
Glucose, Bld: 76 mg/dL (ref 70–99)
Potassium: 3.8 mmol/L (ref 3.5–5.1)
Sodium: 137 mmol/L (ref 135–145)

## 2021-03-06 LAB — CBC
HCT: 38.4 % (ref 36.0–46.0)
Hemoglobin: 12 g/dL (ref 12.0–15.0)
MCH: 27.9 pg (ref 26.0–34.0)
MCHC: 31.3 g/dL (ref 30.0–36.0)
MCV: 89.3 fL (ref 80.0–100.0)
Platelets: 249 10*3/uL (ref 150–400)
RBC: 4.3 MIL/uL (ref 3.87–5.11)
RDW: 15.9 % — ABNORMAL HIGH (ref 11.5–15.5)
WBC: 5.1 10*3/uL (ref 4.0–10.5)
nRBC: 0 % (ref 0.0–0.2)

## 2021-03-06 NOTE — Progress Notes (Signed)
Spoke w/ via phone for pre-op interview---patient Lab needs dos----              CBC, BMP, UPT Lab results------ Echo 01/21/2014 Epic COVID test -----patient states asymptomatic no test needed Arrive at -------0530 NPO after [facility] Med rec completed Medications to take morning of surgery -----none; Hold Ibuprofen until post-procedure. Diabetic medication -----NA Patient instructed no nail polish to be worn day of surgery Patient instructed to bring photo id and insurance card day of surgery Patient aware to have Driver (ride ) / caregiver    for 24 hours after surgery  Patient Special Instructions -----NA Pre-Op special Istructions -----NA Patient verbalized understanding of instructions that were given at this phone interview. Patient denies shortness of breath, chest pain, fever, cough at this phone interview.

## 2021-03-08 NOTE — H&P (Signed)
Pamela Holland is an 46 y.o. female. She is having regular menses monthly, some heavy, known fibroids, has had hysteroscopic myomectomy in the past.  She would like to proceed with repeat hysteroscopic myomectomy prior to trying to conceive  Pertinent Gynecological History: Last mammogram: normal Date: 10/2020 Last pap: normal Date: 2020 OB History: G0   Menstrual History: Patient's last menstrual period was 02/16/2021.    Past Medical History:  Diagnosis Date   Palpitations     Past Surgical History:  Procedure Laterality Date   CYSTECTOMY     UTERINE FIBROID SURGERY     WISDOM TOOTH EXTRACTION      Family History  Problem Relation Age of Onset   Hypertension Mother    Diabetes Mother    Hypertension Father    Heart attack Father    Heart disease Other    Heart attack Other    Stroke Other    Hypertension Other     Social History:  reports that she has never smoked. She has never used smokeless tobacco. She reports that she does not drink alcohol and does not use drugs.  Allergies: No Known Allergies  No medications prior to admission.    Review of Systems  Respiratory: Negative.    Cardiovascular: Negative.    Height 5\' 6"  (1.676 m), weight 56.2 kg, last menstrual period 02/16/2021. Physical Exam Constitutional:      Appearance: Normal appearance.  Cardiovascular:     Rate and Rhythm: Normal rate and regular rhythm.     Heart sounds: No murmur heard. Pulmonary:     Effort: Pulmonary effort is normal. No respiratory distress.     Breath sounds: Normal breath sounds.  Abdominal:     General: There is no distension.     Palpations: Abdomen is soft. There is no mass.     Tenderness: There is no abdominal tenderness.  Genitourinary:    General: Normal vulva.     Comments: Uterus slightly enlarged and irregular No adnexal mass Musculoskeletal:     Cervical back: Normal range of motion and neck supple.  Neurological:     Mental Status: She is alert.     No results found for this or any previous visit (from the past 24 hour(s)).  No results found.  Assessment/Plan: Symptomatic submucosal myomatous uterus with menorrhagia and desire to conceive.  Medical and surgical options discussed.  Surgical procedure, risks, benefits, alternatives,, chances of improving symptoms all discussed, questions answered.  Will admit for hysteroscopy with myosure resection of submucosal myoma  Blane Ohara Lizzete Gough 03/08/2021, 7:19 PM

## 2021-03-08 NOTE — Anesthesia Preprocedure Evaluation (Addendum)
Anesthesia Evaluation  Patient identified by MRN, date of birth, ID band Patient awake    Reviewed: Allergy & Precautions, NPO status , Patient's Chart, lab work & pertinent test results  Airway Mallampati: I  TM Distance: >3 FB Neck ROM: Full    Dental  (+) Teeth Intact, Dental Advisory Given   Pulmonary neg pulmonary ROS,    Pulmonary exam normal breath sounds clear to auscultation       Cardiovascular Normal cardiovascular exam Rhythm:Regular Rate:Normal  Hx palpitations- on propanolol   Neuro/Psych negative neurological ROS  negative psych ROS   GI/Hepatic negative GI ROS, Neg liver ROS,   Endo/Other  negative endocrine ROS  Renal/GU negative Renal ROS  Female GU complaint     Musculoskeletal negative musculoskeletal ROS (+)   Abdominal   Peds  Hematology negative hematology ROS (+) hct 38.4   Anesthesia Other Findings   Reproductive/Obstetrics negative OB ROS                            Anesthesia Physical Anesthesia Plan  ASA: 2  Anesthesia Plan: General   Post-op Pain Management: Tylenol PO (pre-op) and Toradol IV (intra-op)   Induction: Intravenous  PONV Risk Score and Plan: 4 or greater and Ondansetron, Dexamethasone, Midazolam, Scopolamine patch - Pre-op and Treatment may vary due to age or medical condition  Airway Management Planned: LMA  Additional Equipment: None  Intra-op Plan:   Post-operative Plan: Extubation in OR  Informed Consent: I have reviewed the patients History and Physical, chart, labs and discussed the procedure including the risks, benefits and alternatives for the proposed anesthesia with the patient or authorized representative who has indicated his/her understanding and acceptance.     Dental advisory given  Plan Discussed with: CRNA  Anesthesia Plan Comments:        Anesthesia Quick Evaluation

## 2021-03-09 ENCOUNTER — Encounter (HOSPITAL_BASED_OUTPATIENT_CLINIC_OR_DEPARTMENT_OTHER)
Admission: RE | Disposition: A | Discharge status: Home / Self Care | Payer: Self-pay | Source: Ambulatory Visit | Attending: Obstetrics and Gynecology

## 2021-03-09 ENCOUNTER — Other Ambulatory Visit: Payer: Self-pay

## 2021-03-09 ENCOUNTER — Ambulatory Visit (HOSPITAL_BASED_OUTPATIENT_CLINIC_OR_DEPARTMENT_OTHER)
Admission: RE | Admit: 2021-03-09 | Discharge: 2021-03-09 | Disposition: A | Discharge status: Home / Self Care | Payer: 59 | Source: Ambulatory Visit | Attending: Obstetrics and Gynecology | Admitting: Obstetrics and Gynecology

## 2021-03-09 ENCOUNTER — Ambulatory Visit (HOSPITAL_BASED_OUTPATIENT_CLINIC_OR_DEPARTMENT_OTHER): Payer: 59 | Admitting: Anesthesiology

## 2021-03-09 ENCOUNTER — Encounter (HOSPITAL_BASED_OUTPATIENT_CLINIC_OR_DEPARTMENT_OTHER): Payer: Self-pay | Admitting: Obstetrics and Gynecology

## 2021-03-09 DIAGNOSIS — Z01818 Encounter for other preprocedural examination: Secondary | ICD-10-CM

## 2021-03-09 DIAGNOSIS — D259 Leiomyoma of uterus, unspecified: Secondary | ICD-10-CM | POA: Diagnosis not present

## 2021-03-09 DIAGNOSIS — D250 Submucous leiomyoma of uterus: Secondary | ICD-10-CM | POA: Diagnosis not present

## 2021-03-09 DIAGNOSIS — N920 Excessive and frequent menstruation with regular cycle: Secondary | ICD-10-CM | POA: Insufficient documentation

## 2021-03-09 HISTORY — PX: DILATATION & CURETTAGE/HYSTEROSCOPY WITH MYOSURE: SHX6511

## 2021-03-09 LAB — POCT PREGNANCY, URINE: Preg Test, Ur: NEGATIVE

## 2021-03-09 SURGERY — DILATATION & CURETTAGE/HYSTEROSCOPY WITH MYOSURE
Anesthesia: General

## 2021-03-09 MED ORDER — ACETAMINOPHEN 500 MG PO TABS
ORAL_TABLET | ORAL | Status: AC
  Filled 2021-03-09: qty 2

## 2021-03-09 MED ORDER — SCOPOLAMINE 1 MG/3DAYS TD PT72
MEDICATED_PATCH | TRANSDERMAL | Status: AC
  Filled 2021-03-09: qty 1

## 2021-03-09 MED ORDER — HYDROMORPHONE HCL 1 MG/ML IJ SOLN: 0.2500 mg | INTRAMUSCULAR | Status: DC | PRN

## 2021-03-09 MED ORDER — PROMETHAZINE HCL 25 MG/ML IJ SOLN: 12.5000 mg | Freq: Once | INTRAMUSCULAR | Status: DC | PRN

## 2021-03-09 MED ORDER — OXYCODONE HCL 5 MG/5ML PO SOLN: 5.0000 mg | Freq: Once | ORAL | Status: DC | PRN

## 2021-03-09 MED ORDER — DEXAMETHASONE SODIUM PHOSPHATE 10 MG/ML IJ SOLN
INTRAMUSCULAR | Status: DC | PRN
  Administered 2021-03-09: 10 mg via INTRAVENOUS

## 2021-03-09 MED ORDER — PROPOFOL 10 MG/ML IV BOLUS
INTRAVENOUS | Status: AC
  Filled 2021-03-09: qty 20

## 2021-03-09 MED ORDER — KETOROLAC TROMETHAMINE 30 MG/ML IJ SOLN
INTRAMUSCULAR | Status: DC | PRN
  Administered 2021-03-09: 30 mg via INTRAVENOUS

## 2021-03-09 MED ORDER — MEPERIDINE HCL 25 MG/ML IJ SOLN: 6.2500 mg | INTRAMUSCULAR | Status: DC | PRN

## 2021-03-09 MED ORDER — PROPOFOL 10 MG/ML IV BOLUS
INTRAVENOUS | Status: DC | PRN
  Administered 2021-03-09: 150 mg via INTRAVENOUS

## 2021-03-09 MED ORDER — MIDAZOLAM HCL 2 MG/2ML IJ SOLN
INTRAMUSCULAR | Status: DC | PRN
  Administered 2021-03-09: 2 mg via INTRAVENOUS

## 2021-03-09 MED ORDER — LACTATED RINGERS IV SOLN: INTRAVENOUS | Status: DC

## 2021-03-09 MED ORDER — SODIUM CHLORIDE 0.9 % IR SOLN
Status: DC | PRN
  Administered 2021-03-09: 4000 mL

## 2021-03-09 MED ORDER — AMISULPRIDE (ANTIEMETIC) 5 MG/2ML IV SOLN: 10.0000 mg | Freq: Once | INTRAVENOUS | Status: DC | PRN

## 2021-03-09 MED ORDER — LIDOCAINE HCL 2 % IJ SOLN
INTRAMUSCULAR | Status: DC | PRN
  Administered 2021-03-09: 16 mL

## 2021-03-09 MED ORDER — DEXAMETHASONE SODIUM PHOSPHATE 10 MG/ML IJ SOLN
INTRAMUSCULAR | Status: AC
  Filled 2021-03-09: qty 1

## 2021-03-09 MED ORDER — SCOPOLAMINE 1 MG/3DAYS TD PT72
1.0000 | MEDICATED_PATCH | TRANSDERMAL | Status: DC
  Administered 2021-03-09: 1.5 mg via TRANSDERMAL

## 2021-03-09 MED ORDER — FENTANYL CITRATE (PF) 250 MCG/5ML IJ SOLN
INTRAMUSCULAR | Status: DC | PRN
  Administered 2021-03-09 (×2): 50 ug via INTRAVENOUS

## 2021-03-09 MED ORDER — KETOROLAC TROMETHAMINE 30 MG/ML IJ SOLN: 30.0000 mg | Freq: Once | INTRAMUSCULAR | Status: DC | PRN

## 2021-03-09 MED ORDER — MIDAZOLAM HCL 2 MG/2ML IJ SOLN
INTRAMUSCULAR | Status: AC
  Filled 2021-03-09: qty 2

## 2021-03-09 MED ORDER — ACETAMINOPHEN 500 MG PO TABS
1000.0000 mg | ORAL_TABLET | Freq: Once | ORAL | Status: AC
  Administered 2021-03-09: 1000 mg via ORAL

## 2021-03-09 MED ORDER — LIDOCAINE 2% (20 MG/ML) 5 ML SYRINGE
INTRAMUSCULAR | Status: DC | PRN
  Administered 2021-03-09: 60 mg via INTRAVENOUS

## 2021-03-09 MED ORDER — FENTANYL CITRATE (PF) 100 MCG/2ML IJ SOLN
INTRAMUSCULAR | Status: AC
  Filled 2021-03-09: qty 2

## 2021-03-09 MED ORDER — ONDANSETRON HCL 4 MG/2ML IJ SOLN
INTRAMUSCULAR | Status: DC | PRN
Start: 2021-03-09 — End: 2021-03-09
  Administered 2021-03-09: 4 mg via INTRAVENOUS

## 2021-03-09 MED ORDER — OXYCODONE HCL 5 MG PO TABS: 5.0000 mg | ORAL_TABLET | Freq: Once | ORAL | Status: DC | PRN

## 2021-03-09 MED ORDER — LACTATED RINGERS IV SOLN
INTRAVENOUS | Status: DC
  Administered 2021-03-09: 1000 mL via INTRAVENOUS

## 2021-03-09 MED ORDER — ONDANSETRON HCL 4 MG/2ML IJ SOLN
INTRAMUSCULAR | Status: AC
  Filled 2021-03-09: qty 2

## 2021-03-09 SURGICAL SUPPLY — 25 items
BIPOLAR CUTTING LOOP 21FR (ELECTRODE)
CATH ROBINSON RED A/P 16FR (CATHETERS) ×3 IMPLANT
DEVICE MYOSURE LITE (MISCELLANEOUS) IMPLANT
DEVICE MYOSURE REACH (MISCELLANEOUS) ×1 IMPLANT
DILATOR CANAL MILEX (MISCELLANEOUS) IMPLANT
DRSG TELFA 3X8 NADH (GAUZE/BANDAGES/DRESSINGS) ×2 IMPLANT
ELECT REM PT RETURN 9FT ADLT (ELECTROSURGICAL)
ELECTRODE REM PT RTRN 9FT ADLT (ELECTROSURGICAL) IMPLANT
GAUZE 4X4 16PLY ~~LOC~~+RFID DBL (SPONGE) ×6 IMPLANT
GLOVE SURG ORTHO LTX SZ7.5 (GLOVE) ×3 IMPLANT
GOWN STRL REUS W/TWL LRG LVL3 (GOWN DISPOSABLE) ×3 IMPLANT
IV NS IRRIG 3000ML ARTHROMATIC (IV SOLUTION) ×6 IMPLANT
KIT PROCEDURE FLUENT (KITS) ×3 IMPLANT
KIT TURNOVER CYSTO (KITS) ×3 IMPLANT
LOOP CUTTING BIPOLAR 21FR (ELECTRODE) IMPLANT
MYOSURE XL FIBROID (MISCELLANEOUS)
PACK VAGINAL MINOR WOMEN LF (CUSTOM PROCEDURE TRAY) ×3 IMPLANT
PAD DRESSING TELFA 3X8 NADH (GAUZE/BANDAGES/DRESSINGS) ×2 IMPLANT
PAD OB MATERNITY 4.3X12.25 (PERSONAL CARE ITEMS) ×3 IMPLANT
PAD PREP 24X48 CUFFED NSTRL (MISCELLANEOUS) ×3 IMPLANT
SEAL CERVICAL OMNI LOK (ABLATOR) IMPLANT
SEAL ROD LENS SCOPE MYOSURE (ABLATOR) ×3 IMPLANT
SYSTEM TISS REMOVAL MYOSURE XL (MISCELLANEOUS) IMPLANT
TOWEL OR 17X26 10 PK STRL BLUE (TOWEL DISPOSABLE) ×3 IMPLANT
WATER STERILE IRR 500ML POUR (IV SOLUTION) ×3 IMPLANT

## 2021-03-09 NOTE — Discharge Instructions (Addendum)
Routine instructions for hysteroscopy with resection of myoma  DISCHARGE INSTRUCTIONS: HYSTEROSCOPY / ENDOMETRIAL ABLATION The following instructions have been prepared to help you care for yourself upon your return home.  May Remove Scop patch on or before  May take Ibuprofen after  May take stool softner while taking narcotic pain medication to prevent constipation.  Drink plenty of water.  Personal hygiene:  Use sanitary pads for vaginal drainage, not tampons.  Shower the day after your procedure.  NO tub baths, pools or Jacuzzis for 2-3 weeks.  Wipe front to back after using the bathroom.  Activity and limitations:  Do NOT drive or operate any equipment for 24 hours. The effects of anesthesia are still present and drowsiness may result.  Do NOT rest in bed all day.  Walking is encouraged.  Walk up and down stairs slowly.  You may resume your normal activity in one to two days or as indicated by your physician. Sexual activity: NO intercourse for at least 2 weeks after the procedure, or as indicated by your Doctor.  Diet: Eat a light meal as desired this evening. You may resume your usual diet tomorrow.  Return to Work: You may resume your work activities in one to two days or as indicated by Marine scientist.  What to expect after your surgery: Expect to have vaginal bleeding/discharge for 2-3 days and spotting for up to 10 days. It is not unusual to have soreness for up to 1-2 weeks. You may have a slight burning sensation when you urinate for the first day. Mild cramps may continue for a couple of days. You may have a regular period in 2-6 weeks.  Call your doctor for any of the following:  Excessive vaginal bleeding or clotting, saturating and changing one pad every hour.  Inability to urinate 6 hours after discharge from hospital.  Pain not relieved by pain medication.  Fever of 100.4 F or greater.  Unusual vaginal discharge or odor.   Post Anesthesia Home Care  Instructions  Activity: Get plenty of rest for the remainder of the day. A responsible individual must stay with you for 24 hours following the procedure.  For the next 24 hours, DO NOT: -Drive a car -Paediatric nurse -Drink alcoholic beverages -Take any medication unless instructed by your physician -Make any legal decisions or sign important papers.  Meals: Start with liquid foods such as gelatin or soup. Progress to regular foods as tolerated. Avoid greasy, spicy, heavy foods. If nausea and/or vomiting occur, drink only clear liquids until the nausea and/or vomiting subsides. Call your physician if vomiting continues.  Special Instructions/Symptoms: Your throat may feel dry or sore from the anesthesia or the breathing tube placed in your throat during surgery. If this causes discomfort, gargle with warm salt water. The discomfort should disappear within 24 hours.  If you had a scopolamine patch placed behind your ear for the management of post- operative nausea and/or vomiting:  1. The medication in the patch is effective for 72 hours, after which it should be removed.  Wrap patch in a tissue and discard in the trash. Wash hands thoroughly with soap and water. 2. You may remove the patch earlier than 72 hours if you experience unpleasant side effects which may include dry mouth, dizziness or visual disturbances. 3. Avoid touching the patch. Wash your hands with soap and water after contact with the patch.

## 2021-03-09 NOTE — Transfer of Care (Signed)
Immediate Anesthesia Transfer of Care Note  Patient: Pamela Holland  Procedure(s) Performed: DILATATION & CURETTAGE/HYSTEROSCOPY WITH MYOSURE RESECTION OF MYOMA  Patient Location: PACU  Anesthesia Type:General  Level of Consciousness: sedated  Airway & Oxygen Therapy: Patient Spontanous Breathing and Patient connected to nasal cannula oxygen  Post-op Assessment: Report given to RN and Post -op Vital signs reviewed and stable  Post vital signs: Reviewed and stable  Last Vitals:  Vitals Value Taken Time  BP 136/92 03/09/21 0811  Temp    Pulse 80 03/09/21 0812  Resp 13 03/09/21 0812  SpO2 100 % 03/09/21 0812  Vitals shown include unvalidated device data.  Last Pain:  Vitals:   03/09/21 0616  TempSrc:   PainSc: 0-No pain      Patients Stated Pain Goal: 4 (62/82/41 7530)  Complications: No notable events documented.

## 2021-03-09 NOTE — Anesthesia Procedure Notes (Signed)
Procedure Name: LMA Insertion Date/Time: 03/09/2021 7:33 AM Performed by: Ignacia Bayley, CRNA Pre-anesthesia Checklist: Patient identified, Emergency Drugs available, Suction available and Patient being monitored Patient Re-evaluated:Patient Re-evaluated prior to induction Oxygen Delivery Method: Circle System Utilized Preoxygenation: Pre-oxygenation with 100% oxygen Induction Type: IV induction Ventilation: Mask ventilation without difficulty LMA: LMA inserted LMA Size: 4.0 Number of attempts: 1 Airway Equipment and Method: Bite block Placement Confirmation: positive ETCO2 Tube secured with: Tape Dental Injury: Teeth and Oropharynx as per pre-operative assessment

## 2021-03-09 NOTE — Anesthesia Postprocedure Evaluation (Signed)
Anesthesia Post Note  Patient: Pamela Holland  Procedure(s) Performed: DILATATION & CURETTAGE/HYSTEROSCOPY WITH MYOSURE RESECTION OF MYOMA     Patient location during evaluation: PACU Anesthesia Type: General Level of consciousness: awake and alert, oriented and patient cooperative Pain management: pain level controlled Vital Signs Assessment: post-procedure vital signs reviewed and stable Respiratory status: spontaneous breathing, nonlabored ventilation and respiratory function stable Cardiovascular status: blood pressure returned to baseline and stable Postop Assessment: no apparent nausea or vomiting Anesthetic complications: no   No notable events documented.  Last Vitals:  Vitals:   03/09/21 0815 03/09/21 0830  BP: 124/86 (!) 134/91  Pulse: 72 81  Resp: 12 16  Temp: 36.8 C   SpO2: 99% 100%    Last Pain:  Vitals:   03/09/21 0830  TempSrc:   PainSc: 0-No pain                 Pervis Hocking

## 2021-03-09 NOTE — Addendum Note (Signed)
Addendum  created 03/09/21 0852 by Ignacia Bayley, CRNA   Intraprocedure Meds edited

## 2021-03-09 NOTE — Interval H&P Note (Signed)
History and Physical Interval Note:  03/09/2021 7:20 AM  Alric Quan  has presented today for surgery, with the diagnosis of leiomyoma of uterus.  The various methods of treatment have been discussed with the patient and family. After consideration of risks, benefits and other options for treatment, the patient has consented to  Procedure(s): DILATATION & CURETTAGE/HYSTEROSCOPY WITH MYOSURE RESECTION OF MYOMA (N/A) as a surgical intervention.  The patient's history has been reviewed, patient examined, no change in status, stable for surgery.  I have reviewed the patient's chart and labs.  Questions were answered to the patient's satisfaction.     Blane Ohara Harrison Zetina

## 2021-03-09 NOTE — Op Note (Signed)
Preoperative diagnosis: Submucosal myoma Postoperative diagnosis: Same Procedure: Hysteroscopy with Myosure resection of myoma Surgeon: Cheri Fowler M.D. Anesthesia: Gen. With an LMA, paracervical block Findings: She had a normal endometrial cavity with a small submucosal myoma posterior fundus, moderate endometrial tissue Estimated blood loss: Minimal Fluid deficit: Through the hysteroscope fluid deficit was about 1000 cc Specimens: Endometrial resection sent for routine pathology Complications: None  Procedure in detail: The patient was taken to the operating room and placed in the dorsosupine position. General anesthesia was induced. She was placed in mobile stirrups and legs were elevated. Perineum and vagina were prepped and draped in the usual sterile fashion. A Graves speculum was inserted in the vagina and the anterior lip of the cervix was grasped with a single-tooth tenaculum. Paracervical block was performed with a total of 16 cc of 2% plain lidocaine. The uterus then sounded to 10 cm. The cervix was gradually dilated to a size 21 dilator without difficulty. The Myosure hysteroscope was inserted and good visualization was achieved.  The submucossal myoma was easily identified.  The Myosure Reach was inserted and the myoma was completely resected, as well as some pieces of endometrial tissue.  The endometrial cavity was now normal, no other visible lesions.  The hysteroscope was removed. The single-tooth tenaculum was removed from the cervix and bleeding was controlled with pressure. All instruments were then removed from the vagina. The patient tolerated the procedure well and was taken to the recovery in stable condition. Counts were correct and she had PAS hose on throughout the procedure.

## 2021-03-12 ENCOUNTER — Encounter (HOSPITAL_BASED_OUTPATIENT_CLINIC_OR_DEPARTMENT_OTHER): Payer: Self-pay | Admitting: Obstetrics and Gynecology

## 2021-03-12 LAB — SURGICAL PATHOLOGY

## 2021-03-27 DIAGNOSIS — N978 Female infertility of other origin: Secondary | ICD-10-CM | POA: Diagnosis not present

## 2021-04-30 ENCOUNTER — Other Ambulatory Visit (HOSPITAL_COMMUNITY): Payer: Self-pay

## 2021-07-04 ENCOUNTER — Other Ambulatory Visit (HOSPITAL_COMMUNITY): Payer: Self-pay

## 2021-08-15 ENCOUNTER — Other Ambulatory Visit (HOSPITAL_COMMUNITY): Payer: Self-pay

## 2021-08-15 MED ORDER — ACYCLOVIR 400 MG PO TABS
ORAL_TABLET | ORAL | 3 refills | Status: DC
  Filled 2021-08-15: qty 60, 30d supply, fill #0
  Filled 2021-09-16: qty 60, 30d supply, fill #1
  Filled 2021-11-01: qty 60, 30d supply, fill #2
  Filled 2022-01-02: qty 60, 30d supply, fill #3
  Filled 2022-03-11: qty 60, 30d supply, fill #4
  Filled 2022-05-05: qty 60, 30d supply, fill #5
  Filled 2022-06-26: qty 60, 30d supply, fill #6

## 2021-08-28 ENCOUNTER — Other Ambulatory Visit (HOSPITAL_COMMUNITY): Payer: Self-pay

## 2021-08-28 MED ORDER — IBUPROFEN 800 MG PO TABS
800.0000 mg | ORAL_TABLET | Freq: Three times a day (TID) | ORAL | 2 refills | Status: DC | PRN
  Filled 2021-08-28: qty 30, 10d supply, fill #0

## 2021-09-16 ENCOUNTER — Other Ambulatory Visit (HOSPITAL_COMMUNITY): Payer: Self-pay

## 2021-09-17 ENCOUNTER — Other Ambulatory Visit (HOSPITAL_COMMUNITY): Payer: Self-pay

## 2021-11-02 ENCOUNTER — Other Ambulatory Visit (HOSPITAL_COMMUNITY): Payer: Self-pay

## 2021-11-21 DIAGNOSIS — Z01419 Encounter for gynecological examination (general) (routine) without abnormal findings: Secondary | ICD-10-CM | POA: Diagnosis not present

## 2021-11-21 DIAGNOSIS — Z1389 Encounter for screening for other disorder: Secondary | ICD-10-CM | POA: Diagnosis not present

## 2021-11-21 DIAGNOSIS — Z1151 Encounter for screening for human papillomavirus (HPV): Secondary | ICD-10-CM | POA: Diagnosis not present

## 2021-11-21 DIAGNOSIS — Z0142 Encounter for cervical smear to confirm findings of recent normal smear following initial abnormal smear: Secondary | ICD-10-CM | POA: Diagnosis not present

## 2021-11-21 DIAGNOSIS — R87610 Atypical squamous cells of undetermined significance on cytologic smear of cervix (ASC-US): Secondary | ICD-10-CM | POA: Diagnosis not present

## 2021-11-21 DIAGNOSIS — Z1231 Encounter for screening mammogram for malignant neoplasm of breast: Secondary | ICD-10-CM | POA: Diagnosis not present

## 2021-11-21 DIAGNOSIS — Z6820 Body mass index (BMI) 20.0-20.9, adult: Secondary | ICD-10-CM | POA: Diagnosis not present

## 2021-11-21 DIAGNOSIS — Z113 Encounter for screening for infections with a predominantly sexual mode of transmission: Secondary | ICD-10-CM | POA: Diagnosis not present

## 2021-11-21 DIAGNOSIS — Z124 Encounter for screening for malignant neoplasm of cervix: Secondary | ICD-10-CM | POA: Diagnosis not present

## 2021-11-21 DIAGNOSIS — Z1272 Encounter for screening for malignant neoplasm of vagina: Secondary | ICD-10-CM | POA: Diagnosis not present

## 2021-11-21 DIAGNOSIS — Z202 Contact with and (suspected) exposure to infections with a predominantly sexual mode of transmission: Secondary | ICD-10-CM | POA: Diagnosis not present

## 2021-11-21 DIAGNOSIS — N920 Excessive and frequent menstruation with regular cycle: Secondary | ICD-10-CM | POA: Diagnosis not present

## 2021-11-21 DIAGNOSIS — Z130 Encounter for screening for diseases of the blood and blood-forming organs and certain disorders involving the immune mechanism: Secondary | ICD-10-CM | POA: Diagnosis not present

## 2022-01-02 ENCOUNTER — Other Ambulatory Visit (HOSPITAL_COMMUNITY): Payer: Self-pay

## 2022-02-15 ENCOUNTER — Other Ambulatory Visit (HOSPITAL_COMMUNITY): Payer: Self-pay

## 2022-02-15 DIAGNOSIS — R519 Headache, unspecified: Secondary | ICD-10-CM | POA: Diagnosis not present

## 2022-02-15 DIAGNOSIS — G4485 Primary stabbing headache: Secondary | ICD-10-CM | POA: Diagnosis not present

## 2022-02-15 DIAGNOSIS — I209 Angina pectoris, unspecified: Secondary | ICD-10-CM | POA: Diagnosis not present

## 2022-02-15 MED ORDER — GABAPENTIN 100 MG PO CAPS
100.0000 mg | ORAL_CAPSULE | Freq: Three times a day (TID) | ORAL | 0 refills | Status: DC | PRN
  Filled 2022-02-15: qty 90, 30d supply, fill #0

## 2022-02-25 ENCOUNTER — Other Ambulatory Visit (HOSPITAL_COMMUNITY): Payer: Self-pay

## 2022-02-25 MED ORDER — METRONIDAZOLE 250 MG PO TABS
250.0000 mg | ORAL_TABLET | Freq: Three times a day (TID) | ORAL | 0 refills | Status: DC
  Filled 2022-02-25: qty 21, 7d supply, fill #0

## 2022-02-25 MED ORDER — AMOXICILLIN 500 MG PO CAPS
ORAL_CAPSULE | ORAL | 0 refills | Status: DC
  Filled 2022-02-25: qty 21, 7d supply, fill #0

## 2022-03-03 ENCOUNTER — Ambulatory Visit (HOSPITAL_COMMUNITY)
Admission: EM | Admit: 2022-03-03 | Discharge: 2022-03-03 | Disposition: A | Discharge status: Home / Self Care | Payer: 59 | Attending: Internal Medicine | Admitting: Internal Medicine

## 2022-03-03 ENCOUNTER — Encounter (HOSPITAL_COMMUNITY): Payer: Self-pay

## 2022-03-03 DIAGNOSIS — J019 Acute sinusitis, unspecified: Secondary | ICD-10-CM

## 2022-03-03 DIAGNOSIS — B9789 Other viral agents as the cause of diseases classified elsewhere: Secondary | ICD-10-CM

## 2022-03-03 DIAGNOSIS — H9202 Otalgia, left ear: Secondary | ICD-10-CM | POA: Diagnosis not present

## 2022-03-03 DIAGNOSIS — H6992 Unspecified Eustachian tube disorder, left ear: Secondary | ICD-10-CM | POA: Diagnosis not present

## 2022-03-03 MED ORDER — BENZONATATE 100 MG PO CAPS: 100.0000 mg | ORAL_CAPSULE | Freq: Three times a day (TID) | ORAL | 0 refills | Status: DC

## 2022-03-03 NOTE — ED Provider Notes (Signed)
[facility]    CSN: [id number] Arrival date & time: 03/03/22  1002      History   Chief Complaint Chief Complaint  Patient presents with   Ear Pain    HPI Pamela Holland is a 47 y.o. female.   Patient presents urgent care for evaluation of left-sided ear pain, sinus pressure, and nasal congestion that started 5 days ago on February 26, 2022.  She is currently taking antibiotics (Flagyl and amoxicillin) prescribed by her periodontist due to recent dental cleaning and is currently on her last day of these medications out of 7 days.  She describes left ear pain as a pressure and a sharp stabbing sensation to the left ear when she tilts her head to the left.  She states she feels like there is fluid in the ear that needs to come out.  Right ear is asymptomatic.  She is not experiencing any sore throat, vision changes, headache, fever/chills, shortness of breath, chest pain, heart palpitations, weakness, body aches, or fatigue.  She reports slight cough but states this is infrequent and dry.  She is not a smoker and denies drug use.  No known sick contacts with similar symptoms.  She has been using Zyrtec and Sudafed over-the-counter with minimal relief of symptoms.  She has attempted drinking plenty of water to try to flush out sinuses and has been using ibuprofen for the left ear pain with some relief.  Tolerating food and fluids well without difficulty or nausea/vomiting.     Past Medical History:  Diagnosis Date   Palpitations     Patient Active Problem List   Diagnosis Date Noted   Palpitations 01/10/2014   Chest pain 01/10/2014    Past Surgical History:  Procedure Laterality Date   CYSTECTOMY     DILATATION & CURETTAGE/HYSTEROSCOPY WITH MYOSURE N/A 03/09/2021   Procedure: DILATATION & CURETTAGE/HYSTEROSCOPY WITH MYOSURE RESECTION OF MYOMA;  Surgeon: Cheri Fowler, MD;  Location: [place];  Service: Gynecology;  Laterality: N/A;   UTERINE FIBROID  SURGERY     WISDOM TOOTH EXTRACTION      OB History   No obstetric history on file.      Home Medications    Prior to Admission medications   Medication Sig Start Date End Date Taking? Authorizing Provider  acyclovir (ZOVIRAX) 200 MG capsule TAKE 1 CAPSULE BY MOUTH 5 TIMES DAILY FOR 5 DAYS 06/23/20  Yes   acyclovir (ZOVIRAX) 400 MG tablet TAKE 1 TABLET BY MOUTH TWICE DAILY FOR SUPPRESSION 08/15/21  Yes   amoxicillin (AMOXIL) 500 MG capsule Take 1 capsule by mouth three times daily for 7 days 02/25/22  Yes   benzonatate (TESSALON) 100 MG capsule Take 1 capsule (100 mg total) by mouth every 8 (eight) hours. 03/03/22  Yes Talbot Grumbling, FNP  ELDERBERRY PO Take by mouth.   Yes [provider]  ferrous sulfate 325 (65 FE) MG tablet Take 1 tablet (325 mg total) by mouth daily. 12/07/13  Yes Cleatrice Burke, PA-C  ibuprofen (ADVIL) 800 MG tablet TAKE 1 TABLET BY MOUTH EVERY 8 HOURS AS NEEDED 08/28/21  Yes   metroNIDAZOLE (FLAGYL) 250 MG tablet Take 1 tablet (250 mg total) by mouth 3 (three) times daily (no alcohol) 02/25/22  Yes   acyclovir (ZOVIRAX) 200 MG capsule TAKE 1 CAPSULE BY MOUTH 5 TIMES DAILY FOR 5 DAYS 11/24/20     gabapentin (NEURONTIN) 100 MG capsule Take 1 capsule (100 mg total) by mouth 3 (three) times  daily as needed. 02/15/22     lidocaine (XYLOCAINE) 2 % solution Use as directed 5 mLs in the mouth or throat as needed for mouth pain. 10/24/19   Volney American, PA-C  metroNIDAZOLE (METROGEL) 0.75 % vaginal gel Insert 1 applicatorful vaginally daily for 5 days. 09/28/20     Multiple Vitamins-Minerals (WOMENS MULTI PO) Take by mouth.    [provider]  propranolol (INDERAL) 10 MG tablet Take 1 tablet (10 mg total) by mouth as needed. Patient not taking: Reported on 03/09/2021 01/10/14   Josue Hector, MD  sulfamethoxazole-trimethoprim (BACTRIM) 400-80 MG tablet TAKE 1 TABLET BY MOUTH AFTER INTERCOURSE 11/24/20     triamcinolone cream (KENALOG) 0.1 % Apply  to affected area twice a day 01/23/21       Family History Family History  Problem Relation Age of Onset   Hypertension Mother    Diabetes Mother    Hypertension Father    Heart attack Father    Heart disease Other    Heart attack Other    Stroke Other    Hypertension Other     Social History Social History   Tobacco Use   Smoking status: Never   Smokeless tobacco: Never  Vaping Use   Vaping Use: Never used  Substance Use Topics   Alcohol use: No   Drug use: No     Allergies   Patient has no known allergies.   Review of Systems Review of Systems Per HPI  Physical Exam Triage Vital Signs ED Triage Vitals  Enc Vitals Group     BP 03/03/22 1018 128/84     Pulse Rate 03/03/22 1018 76     Resp 03/03/22 1018 14     Temp 03/03/22 1018 98.4 F (36.9 C)     Temp Source 03/03/22 1018 Oral     SpO2 03/03/22 1018 97 %     Weight --      Height --      Head Circumference --      Peak Flow --      Pain Score 03/03/22 1015 5     Pain Loc --      Pain Edu? --      Excl. in [place]? --    No data found.  Updated Vital Signs BP 128/84 (BP Location: Left Arm)   Pulse 76   Temp 98.4 F (36.9 C) (Oral)   Resp 14   LMP 02/15/2022 (Approximate)   SpO2 97%   Visual Acuity Right Eye Distance:   Left Eye Distance:   Bilateral Distance:    Right Eye Near:   Left Eye Near:    Bilateral Near:     Physical Exam Vitals and nursing note reviewed.  Constitutional:      Appearance: She is not ill-appearing or toxic-appearing.  HENT:     Head: Normocephalic and atraumatic.     Right Ear: Hearing, ear canal and external ear normal. No drainage, swelling or tenderness. Tympanic membrane is injected. Tympanic membrane is not perforated.     Left Ear: Hearing, ear canal and external ear normal. No drainage, swelling or tenderness. Tympanic membrane is injected. Tympanic membrane is not perforated.     Nose: Congestion present.     Mouth/Throat:     Lips: Pink.     Mouth:  Mucous membranes are moist.     Dentition: Normal dentition. No dental tenderness.     Tongue: No lesions. Tongue does not deviate from midline.  Palate: No mass and lesions.     Pharynx: Uvula midline. Posterior oropharyngeal erythema present. No pharyngeal swelling, oropharyngeal exudate or uvula swelling.     Tonsils: No tonsillar exudate or tonsillar abscesses.  Eyes:     General: Lids are normal. Vision grossly intact. Gaze aligned appropriately.     Extraocular Movements: Extraocular movements intact.     Conjunctiva/sclera: Conjunctivae normal.  Neck:     Trachea: Trachea and phonation normal.  Cardiovascular:     Rate and Rhythm: Normal rate and regular rhythm.     Heart sounds: Normal heart sounds, S1 normal and S2 normal.  Pulmonary:     Effort: Pulmonary effort is normal. No respiratory distress.     Breath sounds: Normal breath sounds and air entry.  Musculoskeletal:     Cervical back: Normal range of motion and neck supple.  Lymphadenopathy:     Cervical: No cervical adenopathy.  Skin:    General: Skin is warm and dry.     Capillary Refill: Capillary refill takes less than 2 seconds.     Findings: No rash.  Neurological:     General: No focal deficit present.     Mental Status: She is alert and oriented to person, place, and time. Mental status is at baseline.     Cranial Nerves: No dysarthria or facial asymmetry.  Psychiatric:        Mood and Affect: Mood normal.        Speech: Speech normal.        Behavior: Behavior normal.        Thought Content: Thought content normal.        Judgment: Judgment normal.      UC Treatments / Results  Labs (all labs ordered are listed, but only abnormal results are displayed) Labs Reviewed - No data to display  EKG   Radiology No results found.  Procedures Procedures (including critical care time)  Medications Ordered in UC Medications - No data to display  Initial Impression / Assessment and Plan / UC Course   I have reviewed the triage vital signs and the nursing notes.  Pertinent labs & imaging results that were available during my care of the patient were reviewed by me and considered in my medical decision making (see chart for details).   1.  Acute viral sinusitis, dysfunction of left eustachian tube, left ear pain Presentation is consistent with acute viral sinusitis that will likely improve with increased fluid intake, as needed use of guaifenesin, and continued use of ibuprofen/Tylenol as needed for aches and pains.  Deferred viral testing based on timing of symptoms.  Patient to continue courses of antibiotics as prescribed by dentist until they are finished.  Left ear discomfort likely due to eustachian tube dysfunction and inflammation.  Encourage patient to continue use of Zyrtec to help with pressure sensation to the left ear as well as other over-the-counter medications to help with symptomatic relief.  Deferred imaging based on stable cardiopulmonary exam and hemodynamically stable vital signs in clinic.  She may return to urgent care if symptoms fail to improve in the next 5 to 7 days with over-the-counter medications.  Tessalon Perles sent to pharmacy to be used as needed for cough every 8 hours.  Encourage increase fluid intake to stay well-hydrated while recovering from viral illness.   Discussed physical exam and available lab work findings in clinic with patient.  Counseled patient regarding appropriate use of medications and potential side effects for all medications  recommended or prescribed today. Discussed red flag signs and symptoms of worsening condition,when to call the PCP office, return to urgent care, and when to seek higher level of care in the emergency department. Patient verbalizes understanding and agreement with plan. All questions answered. Patient discharged in stable condition.    Final Clinical Impressions(s) / UC Diagnoses   Final diagnoses:  Acute viral sinusitis   Dysfunction of left eustachian tube  Left ear pain     Discharge Instructions      You have a viral upper respiratory infection. Continue taking antibiotics as prescribed by dentist.   Use the following medicines to help with symptoms: - Plain Mucinex (guaifenesin) over the counter as directed every 12 hours to thin mucous so that you are able to get it out of your body easier. Drink plenty of water while taking this medication so that it works well in your body (at least 8 cups a day).  - Tylenol 1,'000mg'$  and/or ibuprofen '600mg'$  every 6 hours with food as needed for aches/pains or fever/chills.  - Tessalon perles every 8 hours as needed for cough.  1 tablespoon of honey in warm water and/or salt water gargles may also help with symptoms. Humidifier to your room will help add water to the air and reduce coughing.  If you develop any new or worsening symptoms, please return.  If your symptoms are severe, please go to the emergency room.  Follow-up with your primary care provider for further evaluation and management of your symptoms as well as ongoing wellness visits.  I hope you feel better!    ED Prescriptions     Medication Sig Dispense Auth. Provider   benzonatate (TESSALON) 100 MG capsule Take 1 capsule (100 mg total) by mouth every 8 (eight) hours. 21 capsule Talbot Grumbling, FNP      PDMP not reviewed this encounter.   Talbot Grumbling, [place] 03/03/22 1044

## 2022-03-03 NOTE — Discharge Instructions (Signed)
You have a viral upper respiratory infection. Continue taking antibiotics as prescribed by dentist.   Use the following medicines to help with symptoms: - Plain Mucinex (guaifenesin) over the counter as directed every 12 hours to thin mucous so that you are able to get it out of your body easier. Drink plenty of water while taking this medication so that it works well in your body (at least 8 cups a day).  - Tylenol 1,'000mg'$  and/or ibuprofen '600mg'$  every 6 hours with food as needed for aches/pains or fever/chills.  - Tessalon perles every 8 hours as needed for cough.  1 tablespoon of honey in warm water and/or salt water gargles may also help with symptoms. Humidifier to your room will help add water to the air and reduce coughing.  If you develop any new or worsening symptoms, please return.  If your symptoms are severe, please go to the emergency room.  Follow-up with your primary care provider for further evaluation and management of your symptoms as well as ongoing wellness visits.  I hope you feel better!

## 2022-03-03 NOTE — ED Triage Notes (Signed)
Pt reports left ear pain since last night. Took ibu last night and this morning. Patient also reports sinus pressure x1 week. Taking allergy and Sudafed for that.  Pt reports she had a cleaning done on her teeth and taking abx for that.

## 2022-04-02 DIAGNOSIS — Z202 Contact with and (suspected) exposure to infections with a predominantly sexual mode of transmission: Secondary | ICD-10-CM | POA: Diagnosis not present

## 2022-04-19 ENCOUNTER — Other Ambulatory Visit (HOSPITAL_COMMUNITY): Payer: Self-pay

## 2022-04-19 MED ORDER — SULFAMETHOXAZOLE-TRIMETHOPRIM 800-160 MG PO TABS
ORAL_TABLET | ORAL | 0 refills | Status: DC
  Filled 2022-04-19: qty 6, 3d supply, fill #0

## 2022-04-23 ENCOUNTER — Other Ambulatory Visit (HOSPITAL_COMMUNITY): Payer: Self-pay

## 2022-04-23 DIAGNOSIS — N760 Acute vaginitis: Secondary | ICD-10-CM | POA: Diagnosis not present

## 2022-04-23 DIAGNOSIS — R300 Dysuria: Secondary | ICD-10-CM | POA: Diagnosis not present

## 2022-04-23 DIAGNOSIS — N9089 Other specified noninflammatory disorders of vulva and perineum: Secondary | ICD-10-CM | POA: Diagnosis not present

## 2022-04-23 MED ORDER — METRONIDAZOLE 0.75 % VA GEL
VAGINAL | 0 refills | Status: DC
  Filled 2022-04-23: qty 70, 5d supply, fill #0

## 2022-04-29 ENCOUNTER — Other Ambulatory Visit (HOSPITAL_COMMUNITY): Payer: Self-pay

## 2022-04-29 MED ORDER — NITROFURANTOIN MONOHYD MACRO 100 MG PO CAPS
100.0000 mg | ORAL_CAPSULE | Freq: Two times a day (BID) | ORAL | 0 refills | Status: DC | PRN
  Filled 2022-04-29: qty 14, 7d supply, fill #0

## 2022-05-14 ENCOUNTER — Other Ambulatory Visit (HOSPITAL_COMMUNITY): Payer: Self-pay

## 2022-05-14 MED ORDER — SULFAMETHOXAZOLE-TRIMETHOPRIM 400-80 MG PO TABS
ORAL_TABLET | ORAL | 2 refills | Status: DC
  Filled 2022-05-14: qty 30, 30d supply, fill #0

## 2022-05-16 ENCOUNTER — Other Ambulatory Visit (HOSPITAL_COMMUNITY): Payer: Self-pay

## 2022-05-16 DIAGNOSIS — B3731 Acute candidiasis of vulva and vagina: Secondary | ICD-10-CM | POA: Diagnosis not present

## 2022-05-16 DIAGNOSIS — R300 Dysuria: Secondary | ICD-10-CM | POA: Diagnosis not present

## 2022-05-16 MED ORDER — CIPROFLOXACIN HCL 500 MG PO TABS
500.0000 mg | ORAL_TABLET | Freq: Two times a day (BID) | ORAL | 0 refills | Status: DC
  Filled 2022-05-16: qty 14, 7d supply, fill #0

## 2022-05-16 MED ORDER — FLUCONAZOLE 150 MG PO TABS
150.0000 mg | ORAL_TABLET | ORAL | 0 refills | Status: DC
  Filled 2022-05-16: qty 3, 9d supply, fill #0

## 2022-06-03 ENCOUNTER — Other Ambulatory Visit (HOSPITAL_COMMUNITY): Payer: Self-pay

## 2022-06-03 MED ORDER — GABAPENTIN 100 MG PO CAPS
100.0000 mg | ORAL_CAPSULE | Freq: Three times a day (TID) | ORAL | 3 refills | Status: DC | PRN
  Filled 2022-06-03: qty 90, 30d supply, fill #0

## 2022-06-04 DIAGNOSIS — Z0000 Encounter for general adult medical examination without abnormal findings: Secondary | ICD-10-CM | POA: Diagnosis not present

## 2022-06-04 DIAGNOSIS — R5383 Other fatigue: Secondary | ICD-10-CM | POA: Diagnosis not present

## 2022-06-04 DIAGNOSIS — E559 Vitamin D deficiency, unspecified: Secondary | ICD-10-CM | POA: Diagnosis not present

## 2022-07-02 ENCOUNTER — Other Ambulatory Visit (HOSPITAL_COMMUNITY): Payer: Self-pay

## 2022-07-02 ENCOUNTER — Encounter (HOSPITAL_COMMUNITY): Payer: Self-pay

## 2022-07-02 ENCOUNTER — Ambulatory Visit (HOSPITAL_COMMUNITY)
Admission: EM | Admit: 2022-07-02 | Discharge: 2022-07-02 | Disposition: A | Discharge status: Home / Self Care | Payer: 59 | Attending: Family Medicine | Admitting: Family Medicine

## 2022-07-02 DIAGNOSIS — U071 COVID-19: Secondary | ICD-10-CM | POA: Diagnosis not present

## 2022-07-02 MED ORDER — ACETAMINOPHEN 325 MG PO TABS
650.0000 mg | ORAL_TABLET | Freq: Once | ORAL | Status: AC
  Administered 2022-07-02: 650 mg via ORAL

## 2022-07-02 MED ORDER — BENZONATATE 100 MG PO CAPS
100.0000 mg | ORAL_CAPSULE | Freq: Three times a day (TID) | ORAL | 0 refills | Status: DC | PRN
  Filled 2022-07-02: qty 21, 7d supply, fill #0

## 2022-07-02 MED ORDER — ACETAMINOPHEN 325 MG PO TABS
ORAL_TABLET | ORAL | Status: AC
  Filled 2022-07-02: qty 2

## 2022-07-02 MED ORDER — CHERATUSSIN AC 100-10 MG/5ML PO SOLN
5.0000 mL | Freq: Four times a day (QID) | ORAL | 0 refills | Status: DC | PRN
Start: 2022-07-02 — End: 2022-10-09
  Filled 2022-07-02: qty 120, 6d supply, fill #0

## 2022-07-02 NOTE — Discharge Instructions (Signed)
Take benzonatate 100 mg, 1 tab every 8 hours as needed for cough.  Robitussin with codeine cough syrup--take 5 mL or 1 teaspoon every 6 hours as needed for cough.  Make sure you are drinking sufficient fluids.

## 2022-07-02 NOTE — ED Triage Notes (Signed)
Pt c/o cough, congestion, and chills since Sunday. States had a positive home covid test. Took OTC meds with no relief.

## 2022-07-02 NOTE — ED Provider Notes (Signed)
[facility]    CSN: [id number] Arrival date & time: 07/02/22  0818      History   Chief Complaint Chief Complaint  Patient presents with   Cough   Covid Positive         HPI Pamela Holland is a 47 y.o. female.    Cough  Here for nasal congestion and cough and chills and malaise.  Symptoms began on May 19.  Yesterday she started feeling some worse, and did a home COVID test that was positive.  No nausea or vomiting or diarrhea.  The cough has bothered her a good bit  We reviewed her past medical history.  She does not take any immunosuppressive medications or does she smoke.  She does not have a history of asthma or diabetes.  Currently she is not taking any prescription medicines.  She shows me a picture of her home test, and it does look positive.  Past Medical History:  Diagnosis Date   Palpitations     Patient Active Problem List   Diagnosis Date Noted   Palpitations 01/10/2014   Chest pain 01/10/2014    Past Surgical History:  Procedure Laterality Date   CYSTECTOMY     DILATATION & CURETTAGE/HYSTEROSCOPY WITH MYOSURE N/A 03/09/2021   Procedure: DILATATION & CURETTAGE/HYSTEROSCOPY WITH MYOSURE RESECTION OF MYOMA;  Surgeon: Lavina Hamman, MD;  Location: [facility];  Service: Gynecology;  Laterality: N/A;   UTERINE FIBROID SURGERY     WISDOM TOOTH EXTRACTION      OB History   No obstetric history on file.      Home Medications    Prior to Admission medications   Medication Sig Start Date End Date Taking? Authorizing Provider  benzonatate (TESSALON) 100 MG capsule Take 1 capsule (100 mg total) by mouth 3 (three) times daily as needed for cough. 07/02/22  Yes [place], Janace Aris, MD  guaiFENesin-codeine (CHERATUSSIN AC) 100-10 MG/5ML syrup Take 5 mLs by mouth 4 (four) times daily as needed for cough. 07/02/22  Yes Zenia Resides, MD  ELDERBERRY PO Take by mouth.    [provider]  ferrous sulfate 325 (65 FE)  MG tablet Take 1 tablet (325 mg total) by mouth daily. 12/07/13   Junious Silk, PA-C  Multiple Vitamins-Minerals (WOMENS MULTI PO) Take by mouth.    [provider]    Family History Family History  Problem Relation Age of Onset   Hypertension Mother    Diabetes Mother    Hypertension Father    Heart attack Father    Heart disease Other    Heart attack Other    Stroke Other    Hypertension Other     Social History Social History   Tobacco Use   Smoking status: Never   Smokeless tobacco: Never  Vaping Use   Vaping Use: Never used  Substance Use Topics   Alcohol use: No   Drug use: No     Allergies   Patient has no known allergies.   Review of Systems Review of Systems  Respiratory:  Positive for cough.      Physical Exam Triage Vital Signs ED Triage Vitals  Enc Vitals Group     BP 07/02/22 0911 121/82     Pulse Rate 07/02/22 0911 93     Resp 07/02/22 0911 18     Temp 07/02/22 0911 (!) 101.8 F (38.8 C)     Temp Source 07/02/22 0911 Oral     SpO2 07/02/22 0911  99 %     Weight --      Height --      Head Circumference --      Peak Flow --      Pain Score 07/02/22 0912 6     Pain Loc --      Pain Edu? --      Excl. in [place]? --    No data found.  Updated Vital Signs BP 121/82 (BP Location: Left Arm)   Pulse 93   Temp (!) 101.8 F (38.8 C) (Oral)   Resp 18   LMP 06/12/2022   SpO2 99%   Visual Acuity Right Eye Distance:   Left Eye Distance:   Bilateral Distance:    Right Eye Near:   Left Eye Near:    Bilateral Near:     Physical Exam Vitals reviewed.  Constitutional:      General: She is not in acute distress.    Appearance: She is not ill-appearing, toxic-appearing or diaphoretic.  HENT:     Nose: Congestion present.     Mouth/Throat:     Mouth: Mucous membranes are moist.     Pharynx: No posterior oropharyngeal erythema.  Eyes:     Extraocular Movements: Extraocular movements intact.     Conjunctiva/sclera: Conjunctivae  normal.     Pupils: Pupils are equal, round, and reactive to light.  Cardiovascular:     Rate and Rhythm: Normal rate and regular rhythm.     Heart sounds: No murmur heard. Pulmonary:     Effort: Pulmonary effort is normal. No respiratory distress.     Breath sounds: No stridor. No wheezing, rhonchi or rales.  Musculoskeletal:     Cervical back: Neck supple.  Lymphadenopathy:     Cervical: No cervical adenopathy.  Skin:    Coloration: Skin is not jaundiced or pale.  Neurological:     General: No focal deficit present.     Mental Status: She is alert and oriented to person, place, and time.  Psychiatric:        Behavior: Behavior normal.      UC Treatments / Results  Labs (all labs ordered are listed, but only abnormal results are displayed) Labs Reviewed - No data to display  EKG   Radiology No results found.  Procedures Procedures (including critical care time)  Medications Ordered in UC Medications  acetaminophen (TYLENOL) tablet 650 mg (650 mg Oral Given 07/02/22 0918)    Initial Impression / Assessment and Plan / UC Course  I have reviewed the triage vital signs and the nursing notes.  Pertinent labs & imaging results that were available during my care of the patient were reviewed by me and considered in my medical decision making (see chart for details).        We discussed her past medical history, and she does not seem to have any risk factors for severe illness.  Plus she has had COVID previously and has had 1 vaccine.  Therefore I am not going to do the Paxlovid prescription.  Tessalon Perles and Robitussin AC are sent in for the cough.  I did a work note to state to return on Monday, May 27, she does not work on weekends.  Work note states she can return sooner if she is 24 hours fever free before that Final Clinical Impressions(s) / UC Diagnoses   Final diagnoses:  COVID     Discharge Instructions      Take benzonatate 100 mg, 1 tab every 8  hours as needed for cough.  Robitussin with codeine cough syrup--take 5 mL or 1 teaspoon every 6 hours as needed for cough.  Make sure you are drinking sufficient fluids.       ED Prescriptions     Medication Sig Dispense Auth. Provider   benzonatate (TESSALON) 100 MG capsule Take 1 capsule (100 mg total) by mouth 3 (three) times daily as needed for cough. 21 capsule Morgan Keinath, Janace Aris, MD   guaiFENesin-codeine (CHERATUSSIN AC) 100-10 MG/5ML syrup Take 5 mLs by mouth 4 (four) times daily as needed for cough. 120 mL Zenia Resides, MD      I have reviewed the PDMP during this encounter.   Zenia Resides, MD 07/02/22 [phone]

## 2022-07-03 ENCOUNTER — Other Ambulatory Visit (HOSPITAL_COMMUNITY): Payer: Self-pay

## 2022-07-09 ENCOUNTER — Other Ambulatory Visit (HOSPITAL_COMMUNITY): Payer: Self-pay

## 2022-07-09 MED ORDER — FLUCONAZOLE 150 MG PO TABS
150.0000 mg | ORAL_TABLET | ORAL | 0 refills | Status: DC
  Filled 2022-07-09: qty 2, 6d supply, fill #0

## 2022-07-16 ENCOUNTER — Other Ambulatory Visit (HOSPITAL_COMMUNITY): Payer: Self-pay

## 2022-07-16 DIAGNOSIS — N9089 Other specified noninflammatory disorders of vulva and perineum: Secondary | ICD-10-CM | POA: Diagnosis not present

## 2022-07-16 DIAGNOSIS — Z202 Contact with and (suspected) exposure to infections with a predominantly sexual mode of transmission: Secondary | ICD-10-CM | POA: Diagnosis not present

## 2022-07-16 DIAGNOSIS — A5900 Urogenital trichomoniasis, unspecified: Secondary | ICD-10-CM | POA: Diagnosis not present

## 2022-07-16 DIAGNOSIS — R300 Dysuria: Secondary | ICD-10-CM | POA: Diagnosis not present

## 2022-07-16 DIAGNOSIS — A609 Anogenital herpesviral infection, unspecified: Secondary | ICD-10-CM | POA: Diagnosis not present

## 2022-07-16 MED ORDER — VALACYCLOVIR HCL 500 MG PO TABS
500.0000 mg | ORAL_TABLET | Freq: Every day | ORAL | 3 refills | Status: DC
  Filled 2022-07-16: qty 30, 30d supply, fill #0
  Filled 2022-08-22 (×2): qty 30, 30d supply, fill #1
  Filled 2022-09-26: qty 30, 30d supply, fill #2
  Filled 2022-11-01 – 2022-11-02 (×2): qty 30, 30d supply, fill #3
  Filled 2022-12-09: qty 30, 30d supply, fill #4
  Filled 2023-01-11: qty 30, 30d supply, fill #5
  Filled 2023-02-20: qty 30, 30d supply, fill #6
  Filled 2023-04-14: qty 30, 30d supply, fill #7
  Filled 2023-05-23: qty 30, 30d supply, fill #8
  Filled 2023-06-28: qty 30, 30d supply, fill #9

## 2022-07-16 MED ORDER — METRONIDAZOLE 500 MG PO TABS
500.0000 mg | ORAL_TABLET | Freq: Two times a day (BID) | ORAL | 0 refills | Status: DC
  Filled 2022-07-16: qty 14, 7d supply, fill #0

## 2022-07-17 DIAGNOSIS — Z202 Contact with and (suspected) exposure to infections with a predominantly sexual mode of transmission: Secondary | ICD-10-CM | POA: Diagnosis not present

## 2022-08-05 ENCOUNTER — Encounter: Payer: Self-pay | Admitting: Family Medicine

## 2022-08-05 ENCOUNTER — Other Ambulatory Visit (HOSPITAL_COMMUNITY): Payer: Self-pay

## 2022-08-05 ENCOUNTER — Ambulatory Visit: Payer: 59 | Admitting: Family Medicine

## 2022-08-05 VITALS — BP 110/76 | HR 75 | Temp 98.3°F | Wt 123.4 lb

## 2022-08-05 DIAGNOSIS — N946 Dysmenorrhea, unspecified: Secondary | ICD-10-CM | POA: Diagnosis not present

## 2022-08-05 DIAGNOSIS — Z7689 Persons encountering health services in other specified circumstances: Secondary | ICD-10-CM

## 2022-08-05 DIAGNOSIS — Z8669 Personal history of other diseases of the nervous system and sense organs: Secondary | ICD-10-CM | POA: Diagnosis not present

## 2022-08-05 DIAGNOSIS — N390 Urinary tract infection, site not specified: Secondary | ICD-10-CM | POA: Diagnosis not present

## 2022-08-05 DIAGNOSIS — Z87898 Personal history of other specified conditions: Secondary | ICD-10-CM | POA: Diagnosis not present

## 2022-08-05 DIAGNOSIS — B009 Herpesviral infection, unspecified: Secondary | ICD-10-CM | POA: Diagnosis not present

## 2022-08-05 DIAGNOSIS — F419 Anxiety disorder, unspecified: Secondary | ICD-10-CM | POA: Diagnosis not present

## 2022-08-05 DIAGNOSIS — J302 Other seasonal allergic rhinitis: Secondary | ICD-10-CM | POA: Insufficient documentation

## 2022-08-05 MED ORDER — IBUPROFEN 800 MG PO TABS
800.0000 mg | ORAL_TABLET | Freq: Two times a day (BID) | ORAL | 2 refills | Status: DC | PRN
  Filled 2022-08-05: qty 30, 15d supply, fill #0
  Filled 2023-01-03: qty 30, 15d supply, fill #1

## 2022-08-05 NOTE — Progress Notes (Signed)
Established Patient Office Visit   Subjective  Patient ID: SHAELYNN DRAGOS, female    DOB: 1975/02/28  Age: 47 y.o. MRN: [id number]  Chief Complaint  Patient presents with   Establish Care    Had bad experience at [facility], last visit was a CPE but did not get results from labs.    Patient is a 47 year old female previously seen at [facility] who presents for establish care and follow-up on ongoing concerns.  History of sciatica: Takes gabapentin 100 mg 3 times daily as needed for symptoms.  Typically has flares of sits on a hard surface or if drives longer than an hour in the car.  Painful menstrual cramping: Takes ibuprofen 800 mg daily as needed.  Was unable to get refill on prescription from previous provider though had refills.  [facility] Vonzell Schlatter, FNP  HSV: Previously on acyclovir twice daily.  Recently switched to Valtrex daily for increased flares.  May get a lesion on buttocks or genitals.  History of palpitations: No recent symptoms.  In the past seen by Dr. Lourena Simmonds, cardiology.  Anxiety: Would like to avoid meds.  In counseling monthly x 3 years.  Seasonal allergies: Takes Zyrtec 10 mg as needed.  History of frequent UTIs: Typically occur postcoital.  Takes Bactrim 400-80 mg after intercourse.  Allergies: NKDA  Social history: Patient is single.  She is currently employed Is a Runner, broadcasting/film/video at [employer].  Patient denies alcohol, tobacco, drug use.    Patient Active Problem List   Diagnosis Date Noted   Palpitations 01/10/2014   Chest pain 01/10/2014   Past Surgical History:  Procedure Laterality Date   CYSTECTOMY     DILATATION & CURETTAGE/HYSTEROSCOPY WITH MYOSURE N/A 03/09/2021   Procedure: DILATATION & CURETTAGE/HYSTEROSCOPY WITH MYOSURE RESECTION OF MYOMA;  Surgeon: Lavina Hamman, MD;  Location: [facility];  Service: Gynecology;  Laterality: N/A;   UTERINE FIBROID SURGERY     WISDOM TOOTH EXTRACTION     Social  History   Tobacco Use   Smoking status: Never   Smokeless tobacco: Never  Vaping Use   Vaping Use: Never used  Substance Use Topics   Alcohol use: No   Drug use: No   Family History  Problem Relation Age of Onset   Hypertension Mother    Diabetes Mother    Hypertension Father    Heart attack Father    Heart disease Other    Heart attack Other    Stroke Other    Hypertension Other    No Known Allergies    ROS Negative unless stated above    Objective:     BP 110/76 (BP Location: Left Arm, Patient Position: Sitting, Cuff Size: Normal)   Pulse 75   Temp 98.3 F (36.8 C) (Oral)   Wt 123 lb 6.4 oz (56 kg)   LMP 07/28/2022 Comment: las one went off 6/02  SpO2 97%   BMI 19.92 kg/m    Physical Exam Constitutional:      General: She is not in acute distress.    Appearance: Normal appearance.  HENT:     Head: Normocephalic and atraumatic.     Nose: Nose normal.     Mouth/Throat:     Mouth: Mucous membranes are moist.  Cardiovascular:     Rate and Rhythm: Normal rate and regular rhythm.     Heart sounds: Normal heart sounds. No murmur heard.    No gallop.  Pulmonary:  Effort: Pulmonary effort is normal. No respiratory distress.     Breath sounds: Normal breath sounds. No wheezing, rhonchi or rales.  Skin:    General: Skin is warm and dry.  Neurological:     Mental Status: She is alert and oriented to person, place, and time.       08/05/2022    4:18 PM 10/14/2017    9:25 AM  Depression screen PHQ 2/9  Decreased Interest 0 0  Down, Depressed, Hopeless 0 0  PHQ - 2 Score 0 0  Altered sleeping 0   Tired, decreased energy 2   Change in appetite 0   Feeling bad or failure about yourself  0   Trouble concentrating 0   Moving slowly or fidgety/restless 0   Suicidal thoughts 0   PHQ-9 Score 2   Difficult doing work/chores Not difficult at all       08/05/2022    4:18 PM  GAD 7 : Generalized Anxiety Score  Nervous, Anxious, on Edge 2  Control/stop  worrying 1  Worry too much - different things 1  Trouble relaxing 0  Restless 0  Easily annoyed or irritable 0  Afraid - awful might happen 0  Total GAD 7 Score 4  Anxiety Difficulty Not difficult at all     No results found for any visits on 08/05/22.    Assessment & Plan:  Menses painful -Continue follow-up with OB/GYN -     Ibuprofen; Take 1 tablet (800 mg) by mouth every 12 hours as needed for cramping.  Dispense: 30 tablet; Refill: 2  Seasonal allergies -Continue OTC Zyrtec.  HSV infection -Continue Valtrex 500 mg daily  History of sciatica -Continue gabapentin 100 mg 3 times daily as needed  Postcoital UTI -Postcoital voiding -Continue Bactrim 400-80 mg as needed postcoital.  Anxiety -GAD-7 score 4 -PHQ-9 score 2 -Continue counseling and self-care.  Encounter to establish care -We reviewed the PMH, PSH, FH, SH, Meds and Allergies. -We provided refills for any medications we will prescribe as needed. -We addressed current concerns per orders and patient instructions. -We have asked for records for pertinent exams, studies, vaccines and notes from previous providers. -We have advised patient to follow up per instructions below.  History of palpitations -Resolved -Continue to monitor.  Follow-up with cardiology.   Return if symptoms worsen or fail to improve.   Deeann Saint, MD

## 2022-08-05 NOTE — Patient Instructions (Signed)
It was nice meeting you today.  You can schedule follow-up as needed.

## 2022-08-22 ENCOUNTER — Other Ambulatory Visit (HOSPITAL_COMMUNITY): Payer: Self-pay

## 2022-09-26 ENCOUNTER — Other Ambulatory Visit (HOSPITAL_COMMUNITY): Payer: Self-pay

## 2022-09-30 ENCOUNTER — Other Ambulatory Visit (HOSPITAL_COMMUNITY): Payer: Self-pay

## 2022-09-30 MED ORDER — SULFAMETHOXAZOLE-TRIMETHOPRIM 400-80 MG PO TABS
1.0000 | ORAL_TABLET | ORAL | 2 refills | Status: AC
  Filled 2022-09-30: qty 30, 30d supply, fill #0

## 2022-10-01 ENCOUNTER — Other Ambulatory Visit (HOSPITAL_COMMUNITY): Payer: Self-pay

## 2022-10-07 ENCOUNTER — Other Ambulatory Visit (HOSPITAL_COMMUNITY): Payer: Self-pay

## 2022-10-07 DIAGNOSIS — N9089 Other specified noninflammatory disorders of vulva and perineum: Secondary | ICD-10-CM | POA: Diagnosis not present

## 2022-10-07 DIAGNOSIS — B3731 Acute candidiasis of vulva and vagina: Secondary | ICD-10-CM | POA: Diagnosis not present

## 2022-10-07 MED ORDER — FLUCONAZOLE 150 MG PO TABS
150.0000 mg | ORAL_TABLET | ORAL | 0 refills | Status: DC
  Filled 2022-10-07: qty 2, 6d supply, fill #0

## 2022-10-08 DIAGNOSIS — R350 Frequency of micturition: Secondary | ICD-10-CM | POA: Insufficient documentation

## 2022-10-08 DIAGNOSIS — N760 Acute vaginitis: Secondary | ICD-10-CM | POA: Insufficient documentation

## 2022-10-08 DIAGNOSIS — N898 Other specified noninflammatory disorders of vagina: Secondary | ICD-10-CM | POA: Insufficient documentation

## 2022-10-08 DIAGNOSIS — B9689 Other specified bacterial agents as the cause of diseases classified elsewhere: Secondary | ICD-10-CM | POA: Insufficient documentation

## 2022-10-08 DIAGNOSIS — A6000 Herpesviral infection of urogenital system, unspecified: Secondary | ICD-10-CM | POA: Insufficient documentation

## 2022-10-08 DIAGNOSIS — R399 Unspecified symptoms and signs involving the genitourinary system: Secondary | ICD-10-CM | POA: Insufficient documentation

## 2022-10-08 DIAGNOSIS — R300 Dysuria: Secondary | ICD-10-CM | POA: Insufficient documentation

## 2022-10-09 ENCOUNTER — Encounter: Payer: Self-pay | Admitting: Family Medicine

## 2022-10-09 ENCOUNTER — Ambulatory Visit: Payer: 59 | Admitting: Family Medicine

## 2022-10-09 VITALS — BP 116/82 | HR 75 | Temp 99.3°F | Wt 128.2 lb

## 2022-10-09 DIAGNOSIS — R600 Localized edema: Secondary | ICD-10-CM

## 2022-10-09 DIAGNOSIS — I7300 Raynaud's syndrome without gangrene: Secondary | ICD-10-CM

## 2022-10-09 DIAGNOSIS — I781 Nevus, non-neoplastic: Secondary | ICD-10-CM

## 2022-10-09 NOTE — Progress Notes (Signed)
Established Patient Office Visit   Subjective  Patient ID: Pamela Holland, female    DOB: 1975-04-02  Age: 47 y.o. MRN: [id number]  Chief Complaint  Patient presents with   Edema    Noticed Wednesday when arrived in [place], and did not go down until Monday. States flight as not long, but has not had this happen before     Patient is a 47 year old female seen for acute concern.  Patient endorses flying to [place] last Wednesday and returning Saturday.  Patient states the flights were relatively short [place] from [place].  During the flight patient noticed bilateral LE edema in feet and ankles.  Feet were sore when walking.  Symptoms improved on Monday.  Still having slight discoloration of skin on feet/ankles.  Patient denies increased sodium intake.  Has flown before without issue.  Patient is typically on her feet a lot while at work.  In the past seen by vascular surgery for history of spider veins.  Patient also mentions fingertips turning white after holding something called.  Fingers then have a burning/tingling sensation for few seconds afterwards.    Past Medical History:  Diagnosis Date   Palpitations    Past Surgical History:  Procedure Laterality Date   CYSTECTOMY     DILATATION & CURETTAGE/HYSTEROSCOPY WITH MYOSURE N/A 03/09/2021   Procedure: DILATATION & CURETTAGE/HYSTEROSCOPY WITH MYOSURE RESECTION OF MYOMA;  Surgeon: Lavina Hamman, MD;  Location: [facility];  Service: Gynecology;  Laterality: N/A;   UTERINE FIBROID SURGERY     WISDOM TOOTH EXTRACTION     Social History   Tobacco Use   Smoking status: Never   Smokeless tobacco: Never  Vaping Use   Vaping status: Never Used  Substance Use Topics   Alcohol use: No   Drug use: No   Family History  Problem Relation Age of Onset   Heart disease Mother    Hypertension Mother    Diabetes Mother    Arthritis Mother    High Cholesterol Mother    Kidney disease Mother     Cancer Father        pancreatic   Hypertension Father    Heart attack Father    Heart disease Other    Heart attack Other    Stroke Other    Hypertension Other    No Known Allergies    ROS Negative unless stated above    Objective:     BP 116/82 (BP Location: Right Arm, Patient Position: Sitting, Cuff Size: Normal)   Pulse 75   Temp 99.3 F (37.4 C) (Oral)   Wt 128 lb 3.2 oz (58.2 kg)   SpO2 100%   BMI 20.69 kg/m  BP Readings from Last 3 Encounters:  10/09/22 116/82  08/05/22 110/76  07/02/22 121/82   Wt Readings from Last 3 Encounters:  10/09/22 128 lb 3.2 oz (58.2 kg)  08/05/22 123 lb 6.4 oz (56 kg)  03/09/21 122 lb 8 oz (55.6 kg)      Physical Exam Constitutional:      General: She is not in acute distress.    Appearance: Normal appearance.  HENT:     Head: Normocephalic and atraumatic.     Nose: Nose normal.     Mouth/Throat:     Mouth: Mucous membranes are moist.  Cardiovascular:     Rate and Rhythm: Normal rate and regular rhythm.     Heart sounds: Normal heart sounds. No murmur heard.  No gallop.     Comments: Trace edema of bilateral feet to mid shins.  Spider veins on the medial upper thighs and lower legs. Pulmonary:     Effort: Pulmonary effort is normal. No respiratory distress.     Breath sounds: Normal breath sounds. No wheezing, rhonchi or rales.  Musculoskeletal:     Right lower leg: Edema present.     Left lower leg: Edema present.     Comments: No calf tenderness, erythema.  Trace edema of bilateral LEs from foot to mid shin.  Skin:    General: Skin is warm and dry.     Comments: Spider veins of bilateral LEs.  Neurological:     Mental Status: She is alert and oriented to person, place, and time.     No results found for any visits on 10/09/22.    Assessment & Plan:  Bilateral leg edema  Spider veins  Raynaud's phenomenon without gangrene  Acute bilateral LE edema improving. Likely dependent edema given recent  travel.  Discussed other causes including DVT though less likely.  patient advised to monitor her sodium intake, elevate LEs when sitting and wear compression socks.  For continued or worsening symptoms follow-up with vascular.  Return if symptoms worsen or fail to improve.   Deeann Saint, MD

## 2022-10-11 ENCOUNTER — Ambulatory Visit: Payer: 59 | Admitting: Family Medicine

## 2022-11-02 ENCOUNTER — Other Ambulatory Visit (HOSPITAL_COMMUNITY): Payer: Self-pay

## 2022-12-04 DIAGNOSIS — Z01419 Encounter for gynecological examination (general) (routine) without abnormal findings: Secondary | ICD-10-CM | POA: Diagnosis not present

## 2022-12-04 DIAGNOSIS — Z6820 Body mass index (BMI) 20.0-20.9, adult: Secondary | ICD-10-CM | POA: Diagnosis not present

## 2022-12-04 DIAGNOSIS — Z1389 Encounter for screening for other disorder: Secondary | ICD-10-CM | POA: Diagnosis not present

## 2022-12-04 DIAGNOSIS — Z130 Encounter for screening for diseases of the blood and blood-forming organs and certain disorders involving the immune mechanism: Secondary | ICD-10-CM | POA: Diagnosis not present

## 2022-12-04 DIAGNOSIS — Z1231 Encounter for screening mammogram for malignant neoplasm of breast: Secondary | ICD-10-CM | POA: Diagnosis not present

## 2022-12-04 DIAGNOSIS — Z113 Encounter for screening for infections with a predominantly sexual mode of transmission: Secondary | ICD-10-CM | POA: Diagnosis not present

## 2022-12-04 DIAGNOSIS — Z202 Contact with and (suspected) exposure to infections with a predominantly sexual mode of transmission: Secondary | ICD-10-CM | POA: Diagnosis not present

## 2022-12-10 ENCOUNTER — Other Ambulatory Visit (HOSPITAL_COMMUNITY): Payer: Self-pay

## 2022-12-11 ENCOUNTER — Encounter (HOSPITAL_COMMUNITY): Payer: Self-pay

## 2022-12-11 ENCOUNTER — Emergency Department (HOSPITAL_COMMUNITY)
Admission: EM | Admit: 2022-12-11 | Discharge: 2022-12-12 | Discharge status: Against Medical Advice | Payer: 59 | Attending: Emergency Medicine | Admitting: Emergency Medicine

## 2022-12-11 DIAGNOSIS — Z5321 Procedure and treatment not carried out due to patient leaving prior to being seen by health care provider: Secondary | ICD-10-CM | POA: Diagnosis not present

## 2022-12-11 DIAGNOSIS — R109 Unspecified abdominal pain: Secondary | ICD-10-CM | POA: Insufficient documentation

## 2022-12-11 LAB — COMPREHENSIVE METABOLIC PANEL
ALT: 21 U/L (ref 0–44)
AST: 26 U/L (ref 15–41)
Albumin: 3.4 g/dL — ABNORMAL LOW (ref 3.5–5.0)
Alkaline Phosphatase: 52 U/L (ref 38–126)
Anion gap: 11 (ref 5–15)
BUN: 17 mg/dL (ref 6–20)
CO2: 20 mmol/L — ABNORMAL LOW (ref 22–32)
Calcium: 9 mg/dL (ref 8.9–10.3)
Chloride: 107 mmol/L (ref 98–111)
Creatinine, Ser: 0.9 mg/dL (ref 0.44–1.00)
GFR, Estimated: >60 mL/min (ref >60)
Glucose, Bld: 101 mg/dL — ABNORMAL HIGH (ref 70–99)
Potassium: 4.3 mmol/L (ref 3.5–5.1)
Sodium: 138 mmol/L (ref 135–145)
Total Bilirubin: 0.3 mg/dL (ref 0.3–1.2)
Total Protein: 6.4 g/dL — ABNORMAL LOW (ref 6.5–8.1)

## 2022-12-11 LAB — CBC
HCT: 35.2 % — ABNORMAL LOW (ref 36.0–46.0)
Hemoglobin: 11.3 g/dL — ABNORMAL LOW (ref 12.0–15.0)
MCH: 29.6 pg (ref 26.0–34.0)
MCHC: 32.1 g/dL (ref 30.0–36.0)
MCV: 92.1 fL (ref 80.0–100.0)
Platelets: 280 10*3/uL (ref 150–400)
RBC: 3.82 MIL/uL — ABNORMAL LOW (ref 3.87–5.11)
RDW: 14.1 % (ref 11.5–15.5)
WBC: 6.4 10*3/uL (ref 4.0–10.5)
nRBC: 0 % (ref 0.0–0.2)

## 2022-12-11 LAB — LIPASE, BLOOD: Lipase: 40 U/L (ref 11–51)

## 2022-12-11 LAB — HCG, SERUM, QUALITATIVE: Preg, Serum: NEGATIVE

## 2022-12-11 NOTE — ED Triage Notes (Signed)
Coming in for abd pain that started tonight around 7, expresses it started in the center of her abd and has gradually migrated down to her lower abd. She is currently on her period but does not believe it is correlated with that and has no had any excessive bleeding outside of her normal period. No nausea and no vomiting currently. She does not express any chest pain as well.

## 2022-12-12 NOTE — ED Notes (Signed)
[facility]BS 

## 2023-01-01 ENCOUNTER — Other Ambulatory Visit (HOSPITAL_COMMUNITY): Payer: Self-pay

## 2023-01-01 MED ORDER — FLUCONAZOLE 150 MG PO TABS
150.0000 mg | ORAL_TABLET | ORAL | 0 refills | Status: DC
  Filled 2023-01-01: qty 2, 6d supply, fill #0

## 2023-01-01 MED ORDER — METRONIDAZOLE 500 MG PO TABS
500.0000 mg | ORAL_TABLET | Freq: Two times a day (BID) | ORAL | 0 refills | Status: DC
  Filled 2023-01-01: qty 14, 7d supply, fill #0

## 2023-01-06 ENCOUNTER — Other Ambulatory Visit (HOSPITAL_COMMUNITY): Payer: Self-pay

## 2023-01-15 ENCOUNTER — Encounter: Payer: Self-pay | Admitting: Family Medicine

## 2023-01-15 ENCOUNTER — Telehealth: Payer: 59 | Admitting: Family Medicine

## 2023-01-15 ENCOUNTER — Telehealth: Payer: Self-pay | Admitting: Family Medicine

## 2023-01-15 DIAGNOSIS — J069 Acute upper respiratory infection, unspecified: Secondary | ICD-10-CM

## 2023-01-15 DIAGNOSIS — R42 Dizziness and giddiness: Secondary | ICD-10-CM

## 2023-01-15 NOTE — Telephone Encounter (Signed)
Spoke with patient.

## 2023-01-15 NOTE — Progress Notes (Signed)
Virtual Visit via Video Note  I connected with Pamela Holland on 01/15/23 at  1:30 PM EST by a video enabled telemedicine application and verified that I am speaking with the correct person using two identifiers.  Location patient: home Location provider:work or home office Persons participating in the virtual visit: patient, provider  I discussed the limitations of evaluation and management by telemedicine and the availability of in person appointments. The patient expressed understanding and agreed to proceed.  CC: cold symptoms x a few days.   HPI: Pt is a 11 yp female seen for acute illness.  Tightness in chest and dry cough started 3 days ago.  Ok Tues.  Wed chills, fever 101.16F, HA, pain in chest, feels weak.  Feeling hungry but has been too tired to eat.  Denies ST, nasal drainage, diarrhea, n/v.  Tried left over cough syrup with codeine, tessalon, halls cough drops.  Pt has someone bringing her a COVID test and Tylenol.  Possible sick contacts include employee at [employer].  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  ROS: See pertinent positives and negatives per HPI.  Past Medical History:  Diagnosis Date   Palpitations     Past Surgical History:  Procedure Laterality Date   CYSTECTOMY     DILATATION & CURETTAGE/HYSTEROSCOPY WITH MYOSURE N/A 03/09/2021   Procedure: DILATATION & CURETTAGE/HYSTEROSCOPY WITH MYOSURE RESECTION OF MYOMA;  Surgeon: Lavina Hamman, MD;  Location: [facility];  Service: Gynecology;  Laterality: N/A;   UTERINE FIBROID SURGERY     WISDOM TOOTH EXTRACTION      Family History  Problem Relation Age of Onset   Heart disease Mother    Hypertension Mother    Diabetes Mother    Arthritis Mother    High Cholesterol Mother    Kidney disease Mother    Cancer Father        pancreatic   Hypertension Father    Heart attack Father    Heart disease Other    Heart attack Other    Stroke Other    Hypertension Other       Current  Outpatient Medications:    ELDERBERRY PO, Take by mouth., Disp: , Rfl:    ferrous sulfate 325 (65 FE) MG tablet, Take 1 tablet (325 mg total) by mouth daily., Disp: 30 tablet, Rfl: 0   fluconazole (DIFLUCAN) 150 MG tablet, Take 1 tablet (150 mg total) by mouth every 3 (three) days., Disp: 2 tablet, Rfl: 0   gabapentin (NEURONTIN) 100 MG capsule, Take 100 mg by mouth 3 (three) times daily., Disp: , Rfl:    ibuprofen (ADVIL) 800 MG tablet, Take 1 tablet (800 mg) by mouth every 12 hours as needed for cramping., Disp: 30 tablet, Rfl: 2   metroNIDAZOLE (FLAGYL) 500 MG tablet, Take 1 tablet (500 mg total) by mouth 2 (two) times daily as directed for 7 days, Disp: 14 tablet, Rfl: 0   Multiple Vitamins-Minerals (WOMENS MULTI PO), Take by mouth., Disp: , Rfl:    sulfamethoxazole-trimethoprim (BACTRIM) 400-80 MG tablet, Take 1 tablet by mouth after intercourse, Disp: 30 tablet, Rfl: 2   valACYclovir (VALTREX) 500 MG tablet, Take 1 tablet (500 mg total) by mouth daily for 30 days., Disp: 90 tablet, Rfl: 3  EXAM:  VITALS per patient if applicable: RR between 12-20 bpm, Temp 19F  GENERAL: alert, oriented, appears well and in no acute distress  HEENT: atraumatic, conjunctiva clear, no obvious abnormalities on inspection of external nose and ears  NECK:  normal movements of the head and neck  LUNGS: on inspection no signs of respiratory distress, breathing rate appears normal, no obvious gross SOB, gasping or wheezing  CV: no obvious cyanosis  MS: moves all visible extremities without noticeable abnormality  PSYCH/NEURO: pleasant and cooperative, no obvious depression or anxiety, speech and thought processing grossly intact  ASSESSMENT AND PLAN:  Discussed the following assessment and plan:  Viral URI with cough -pt to take at home COVID test later today and notify clinic with results.  If positive will send in Rx for antiviral med. -Continue supportive care with OTC cough/cold medications.   Consider Flonase or antihistamine.  Okay to use NSAIDs or Tylenol as needed for fever, chills, pain/discomfort. -Given note for work -Given strict precautions for continued or worsening symptoms.  Dizziness -Possibly 2/2 eustachian tube dysfunction with current acute illness.  Also consider anemia given heavy menses. -Patient scheduled for follow-up in person to evaluate further and for labs.  I discussed the assessment and treatment plan with the patient. The patient was provided an opportunity to ask questions and all were answered. The patient agreed with the plan and demonstrated an understanding of the instructions.   The patient was advised to call back or seek an in-person evaluation if the symptoms worsen or if the condition fails to improve as anticipated.   Deeann Saint, MD

## 2023-01-15 NOTE — Telephone Encounter (Signed)
Pt states she spoke to triage nurse this morning around 8:00am for her symptoms. Triage note is in provider's folder. She would like to know if provider could give her a doctors note for her work based on her call with the triage nurse line. She requested for note to be uploaded into MyChart if possible.

## 2023-01-17 ENCOUNTER — Telehealth: Payer: Self-pay | Admitting: Family Medicine

## 2023-01-17 NOTE — Telephone Encounter (Signed)
Pt call and stated she tested negative for covid and want dr. Salomon Fick to call her in something for her congestion to  [facility] Phone: [phone]  Fax: [phone]

## 2023-01-21 ENCOUNTER — Other Ambulatory Visit (HOSPITAL_COMMUNITY): Payer: Self-pay

## 2023-01-21 ENCOUNTER — Encounter: Payer: Self-pay | Admitting: Family Medicine

## 2023-01-21 ENCOUNTER — Ambulatory Visit: Payer: 59 | Admitting: Family Medicine

## 2023-01-21 VITALS — BP 104/60 | HR 76 | Temp 98.6°F | Ht 66.0 in | Wt 130.8 lb

## 2023-01-21 DIAGNOSIS — R059 Cough, unspecified: Secondary | ICD-10-CM

## 2023-01-21 MED ORDER — DOXYCYCLINE HYCLATE 100 MG PO CAPS
100.0000 mg | ORAL_CAPSULE | Freq: Two times a day (BID) | ORAL | 0 refills | Status: DC
  Filled 2023-01-21: qty 14, 7d supply, fill #0

## 2023-01-21 NOTE — Progress Notes (Signed)
Established Patient Office Visit  Subjective   Patient ID: Pamela Holland, female    DOB: Jul 06, 1975  Age: 47 y.o. MRN: [id number]  Chief Complaint  Patient presents with   Medical Management of Chronic Issues   Cough    Patient complains of cough, x1 week, Productive with green sputum, Tried Robitussin, Mucinex and Ibuprofen     HPI   Pamela Holland is seen today for some progressive cough past week.  She started off with dry cough but now mostly productive of green sputum.  She has tried Robitussin, Mucinex DM, Tessalon, and ibuprofen without much improvement.  She had some fever last week but none now.  Non-smoker.  No history of asthma.  No pleuritic pain.  Some intermittent sore throat.  No facial pain.  Occasional headaches.  Past Medical History:  Diagnosis Date   Palpitations    Past Surgical History:  Procedure Laterality Date   CYSTECTOMY     DILATATION & CURETTAGE/HYSTEROSCOPY WITH MYOSURE N/A 03/09/2021   Procedure: DILATATION & CURETTAGE/HYSTEROSCOPY WITH MYOSURE RESECTION OF MYOMA;  Surgeon: Lavina Hamman, MD;  Location: [facility];  Service: Gynecology;  Laterality: N/A;   UTERINE FIBROID SURGERY     WISDOM TOOTH EXTRACTION      reports that she has never smoked. She has never used smokeless tobacco. She reports that she does not drink alcohol and does not use drugs. family history includes Arthritis in her mother; Cancer in her father; Diabetes in her mother; Heart attack in her father and another family member; Heart disease in her mother and another family member; High Cholesterol in her mother; Hypertension in her father, mother, and another family member; Kidney disease in her mother; Stroke in an other family member. No Known Allergies  Review of Systems  Constitutional:  Negative for chills.  HENT:  Negative for sinus pain.   Respiratory:  Positive for cough and sputum production. Negative for hemoptysis and wheezing.   Cardiovascular:   Negative for chest pain.      Objective:     BP 104/60 (BP Location: Left Arm, Patient Position: Sitting, Cuff Size: Normal)   Pulse 76   Temp 98.6 F (37 C) (Oral)   Ht 5\' 6"  (1.676 m)   Wt 130 lb 12.8 oz (59.3 kg)   SpO2 99%   BMI 21.11 kg/m  BP Readings from Last 3 Encounters:  01/21/23 104/60  12/11/22 137/86  10/09/22 116/82   Wt Readings from Last 3 Encounters:  01/21/23 130 lb 12.8 oz (59.3 kg)  10/09/22 128 lb 3.2 oz (58.2 kg)  08/05/22 123 lb 6.4 oz (56 kg)      Physical Exam Vitals reviewed.  Constitutional:      General: She is not in acute distress.    Appearance: She is not ill-appearing.  HENT:     Right Ear: Tympanic membrane normal.     Left Ear: Tympanic membrane normal.     Mouth/Throat:     Mouth: Mucous membranes are moist.     Pharynx: Oropharynx is clear.  Cardiovascular:     Rate and Rhythm: Normal rate and regular rhythm.  Pulmonary:     Effort: Pulmonary effort is normal.     Breath sounds: Normal breath sounds. No wheezing or rales.  Neurological:     Mental Status: She is alert.      No results found for any visits on 01/21/23.    The ASCVD Risk score (Arnett DK, et al., 2019) failed to  calculate for the following reasons:   Cannot find a previous HDL lab   Cannot find a previous total cholesterol lab    Assessment & Plan:   Cough.  Suspect acute bronchitis.  We explained that most bronchitis is viral.  However, she feels worse than she did last week at onset.  Nonfocal exam.  O2 sat 99% room air.  No wheezes or rales on exam.  -Continue Mucinex over-the-counter 1200 mg twice daily -Stay well-hydrated -We elected to go and cover with doxycycline 100 mg twice daily -Follow-up immediately for any fever or increased shortness of breath  Evelena Peat, MD

## 2023-01-21 NOTE — Patient Instructions (Signed)
Consider over the counter plain Mucinex 1,200 mg  twice daily  Stay well hydrated  Follow up promptly for any fever or increased shortness of breath.

## 2023-01-23 NOTE — Telephone Encounter (Signed)
OTC Mucinex advised.

## 2023-01-23 NOTE — Telephone Encounter (Signed)
Called and left a detailed message per [facility]

## 2023-01-31 ENCOUNTER — Other Ambulatory Visit: Payer: Self-pay

## 2023-01-31 ENCOUNTER — Encounter (HOSPITAL_COMMUNITY): Payer: Self-pay

## 2023-01-31 ENCOUNTER — Emergency Department (HOSPITAL_BASED_OUTPATIENT_CLINIC_OR_DEPARTMENT_OTHER)
Admission: EM | Admit: 2023-01-31 | Discharge: 2023-01-31 | Disposition: A | Discharge status: Home / Self Care | Payer: 59 | Attending: Emergency Medicine | Admitting: Emergency Medicine

## 2023-01-31 ENCOUNTER — Ambulatory Visit (HOSPITAL_COMMUNITY)
Admission: RE | Admit: 2023-01-31 | Discharge: 2023-01-31 | Disposition: A | Discharge status: Home / Self Care | Payer: 59 | Source: Ambulatory Visit | Attending: Nurse Practitioner | Admitting: Nurse Practitioner

## 2023-01-31 ENCOUNTER — Ambulatory Visit: Payer: Self-pay | Admitting: Family Medicine

## 2023-01-31 ENCOUNTER — Emergency Department (HOSPITAL_BASED_OUTPATIENT_CLINIC_OR_DEPARTMENT_OTHER): Payer: 59 | Admitting: Radiology

## 2023-01-31 ENCOUNTER — Encounter (HOSPITAL_BASED_OUTPATIENT_CLINIC_OR_DEPARTMENT_OTHER): Payer: Self-pay

## 2023-01-31 VITALS — BP 103/72 | HR 78 | Temp 98.4°F | Resp 18 | Ht 66.0 in | Wt 130.0 lb

## 2023-01-31 DIAGNOSIS — R0789 Other chest pain: Secondary | ICD-10-CM

## 2023-01-31 DIAGNOSIS — K219 Gastro-esophageal reflux disease without esophagitis: Secondary | ICD-10-CM

## 2023-01-31 DIAGNOSIS — R072 Precordial pain: Secondary | ICD-10-CM | POA: Insufficient documentation

## 2023-01-31 DIAGNOSIS — R1312 Dysphagia, oropharyngeal phase: Secondary | ICD-10-CM | POA: Diagnosis not present

## 2023-01-31 DIAGNOSIS — R079 Chest pain, unspecified: Secondary | ICD-10-CM | POA: Diagnosis not present

## 2023-01-31 DIAGNOSIS — R1310 Dysphagia, unspecified: Secondary | ICD-10-CM | POA: Diagnosis not present

## 2023-01-31 LAB — CBC
HCT: 38.8 % (ref 36.0–46.0)
Hemoglobin: 12.5 g/dL (ref 12.0–15.0)
MCH: 29.3 pg (ref 26.0–34.0)
MCHC: 32.2 g/dL (ref 30.0–36.0)
MCV: 90.9 fL (ref 80.0–100.0)
Platelets: 291 10*3/uL (ref 150–400)
RBC: 4.27 MIL/uL (ref 3.87–5.11)
RDW: 14.2 % (ref 11.5–15.5)
WBC: 5.8 10*3/uL (ref 4.0–10.5)
nRBC: 0 % (ref 0.0–0.2)

## 2023-01-31 LAB — BASIC METABOLIC PANEL
Anion gap: 9 (ref 5–15)
BUN: 15 mg/dL (ref 6–20)
CO2: 25 mmol/L (ref 22–32)
Calcium: 9.7 mg/dL (ref 8.9–10.3)
Chloride: 104 mmol/L (ref 98–111)
Creatinine, Ser: 0.72 mg/dL (ref 0.44–1.00)
GFR, Estimated: >60 mL/min (ref >60)
Glucose, Bld: 77 mg/dL (ref 70–99)
Potassium: 3.7 mmol/L (ref 3.5–5.1)
Sodium: 138 mmol/L (ref 135–145)

## 2023-01-31 LAB — TROPONIN I (HIGH SENSITIVITY): Troponin I (High Sensitivity): <2 ng/L (ref <18)

## 2023-01-31 LAB — PREGNANCY, URINE: Preg Test, Ur: NEGATIVE

## 2023-01-31 MED ORDER — ALUM & MAG HYDROXIDE-SIMETH 200-200-20 MG/5ML PO SUSP
30.0000 mL | Freq: Once | ORAL | Status: AC
  Administered 2023-01-31: 30 mL via ORAL

## 2023-01-31 MED ORDER — PANTOPRAZOLE SODIUM 40 MG PO TBEC
40.0000 mg | DELAYED_RELEASE_TABLET | Freq: Every day | ORAL | 0 refills | Status: AC
Start: 2023-01-31 — End: ?
  Filled 2023-01-31: qty 30, 30d supply, fill #0

## 2023-01-31 MED ORDER — LIDOCAINE VISCOUS HCL 2 % MT SOLN
15.0000 mL | Freq: Once | OROMUCOSAL | Status: AC
  Administered 2023-01-31: 15 mL via OROMUCOSAL

## 2023-01-31 MED ORDER — ALUM & MAG HYDROXIDE-SIMETH 200-200-20 MG/5ML PO SUSP
ORAL | Status: AC
  Filled 2023-01-31: qty 30

## 2023-01-31 MED ORDER — LIDOCAINE VISCOUS HCL 2 % MT SOLN
OROMUCOSAL | Status: AC
  Filled 2023-01-31: qty 15

## 2023-01-31 MED ORDER — FAMOTIDINE 20 MG PO TABS
20.0000 mg | ORAL_TABLET | Freq: Two times a day (BID) | ORAL | 0 refills | Status: DC
  Filled 2023-01-31: qty 30, 15d supply, fill #0

## 2023-01-31 MED ORDER — SUCRALFATE 1 G PO TABS
1.0000 g | ORAL_TABLET | Freq: Three times a day (TID) | ORAL | 1 refills | Status: AC
Start: 2023-01-31 — End: ?
  Filled 2023-01-31: qty 60, 15d supply, fill #0

## 2023-01-31 MED ORDER — PANTOPRAZOLE SODIUM 40 MG PO TBEC
40.0000 mg | DELAYED_RELEASE_TABLET | Freq: Once | ORAL | Status: AC
  Administered 2023-01-31: 40 mg via ORAL
  Filled 2023-01-31: qty 1

## 2023-01-31 NOTE — ED Notes (Signed)
Pt requested to speak with NP at this time. Pt had previously declined GI cocktail however would now like to try it to see if symptoms improve/resolve. NP made aware. See orders.

## 2023-01-31 NOTE — Discharge Instructions (Signed)
Please read and follow all provided instructions.  Your diagnoses today include:  1. Precordial pain   2. Odynophagia     Tests performed today include: An EKG of your heart A chest x-ray Cardiac enzymes - a blood test for heart muscle damage Blood counts and electrolytes Vital signs. See below for your results today.   Medications prescribed:  Pantoprazole - stomach acid reducer  Pepcid (famotidine) - antihistamine  You can find this medication over-the-counter.   DO NOT exceed:  20mg  Pepcid every 12 hours  Carafate - for stomach upset and to protect your stomach  Take any prescribed medications only as directed.  Follow-up instructions: Please follow-up with your primary care provider as soon as you can for further evaluation of your symptoms.   Return instructions:  SEEK IMMEDIATE MEDICAL ATTENTION IF: You have severe chest pain, especially if the pain is crushing or pressure-like and spreads to the arms, back, neck, or jaw, or if you have sweating, nausea or vomiting, or trouble with breathing. THIS IS AN EMERGENCY. Do not wait to see if the pain will go away. Get medical help at once. Call 911. DO NOT drive yourself to the hospital.  Your chest pain gets worse and does not go away after a few minutes of rest.  You have an attack of chest pain lasting longer than what you usually experience.  You have significant dizziness, if you pass out, or have trouble walking.  You have chest pain not typical of your usual pain for which you originally saw your caregiver.  You have any other emergent concerns regarding your health.  Additional Information: Chest pain comes from many different causes. Your caregiver has diagnosed you as having chest pain that is not specific for one problem, but does not require admission.  You are at low risk for an acute heart condition or other serious illness.   Your vital signs today were: BP 136/88   Pulse 71   Temp 98 F (36.7 C)   Resp  19   Ht 5\' 6"  (1.676 m)   Wt 59 kg   LMP 01/29/2023 (Exact Date)   SpO2 100%   BMI 20.99 kg/m  If your blood pressure (BP) was elevated above 135/85 this visit, please have this repeated by your doctor within one month. --------------

## 2023-01-31 NOTE — ED Provider Notes (Signed)
[facility]    CSN: [id number] Arrival date & time: 01/31/23  1037      History   Chief Complaint Chief Complaint  Patient presents with   Chest Discomfort    HPI Pamela Holland is a 47 y.o. female.   HPI  She is in today for evaluation of 10 out of 10 chest discomfort.  She reports that when she eats or drinks she is having pains.  She endorses that this started on Tuesday night after eating hot dogs, small amount of baked foods in potatoes and onions.  She was recently treated with doxycycline for bronchitis and was fearful that the that may be related.  She has a vague history of cardiac disease in her family no sudden cardiac death.  She is a caregiver for her mother.  She denies any increased stress.  She reports that she does have a history of this in increase gas.  She normally takes over-the-counter Tums which is effective.  She endorses that she has been using Pepto-Bismol as the days with no relief.  She denies any fever, chills, nausea, shortness of breath. Past Medical History:  Diagnosis Date   Palpitations     Patient Active Problem List   Diagnosis Date Noted   Bacterial vaginosis 10/08/2022   Dysuria 10/08/2022   Genital herpes simplex 10/08/2022   Increased frequency of urination 10/08/2022   Symptoms involving urinary system 10/08/2022   Vaginal discharge 10/08/2022   Seasonal allergies 08/05/2022   HSV infection 08/05/2022   History of sciatica 08/05/2022   Postcoital UTI 08/05/2022   Anxiety 08/05/2022   History of palpitations 08/05/2022   Menses painful 08/05/2022   Palpitations 01/10/2014   Chest pain 01/10/2014    Past Surgical History:  Procedure Laterality Date   CYSTECTOMY     DILATATION & CURETTAGE/HYSTEROSCOPY WITH MYOSURE N/A 03/09/2021   Procedure: DILATATION & CURETTAGE/HYSTEROSCOPY WITH MYOSURE RESECTION OF MYOMA;  Surgeon: Lavina Hamman, MD;  Location: [facility];  Service: Gynecology;  Laterality: N/A;    UTERINE FIBROID SURGERY     WISDOM TOOTH EXTRACTION      OB History   No obstetric history on file.      Home Medications    Prior to Admission medications   Medication Sig Start Date End Date Taking? Authorizing Provider  doxycycline (VIBRAMYCIN) 100 MG capsule Take 1 capsule (100 mg total) by mouth 2 (two) times daily. 01/21/23   Burchette, Elberta Fortis, MD  ELDERBERRY PO Take by mouth.    [provider]  ferrous sulfate 325 (65 FE) MG tablet Take 1 tablet (325 mg total) by mouth daily. 12/07/13   Junious Silk, PA-C  fluconazole (DIFLUCAN) 150 MG tablet Take 1 tablet (150 mg total) by mouth every 3 (three) days. 01/01/23     gabapentin (NEURONTIN) 100 MG capsule Take 100 mg by mouth 3 (three) times daily.    [provider]  ibuprofen (ADVIL) 800 MG tablet Take 1 tablet (800 mg) by mouth every 12 hours as needed for cramping. 08/05/22   Deeann Saint, MD  metroNIDAZOLE (FLAGYL) 500 MG tablet Take 1 tablet (500 mg total) by mouth 2 (two) times daily as directed for 7 days 01/01/23     Multiple Vitamins-Minerals (WOMENS MULTI PO) Take by mouth.    [provider]  sulfamethoxazole-trimethoprim (BACTRIM) 400-80 MG tablet Take 1 tablet by mouth after intercourse 09/30/22     valACYclovir (VALTREX) 500 MG tablet Take 1 tablet (500 mg  total) by mouth daily for 30 days. 07/16/22       Family History Family History  Problem Relation Age of Onset   Heart disease Mother    Hypertension Mother    Diabetes Mother    Arthritis Mother    High Cholesterol Mother    Kidney disease Mother    Cancer Father        pancreatic   Hypertension Father    Heart attack Father    Heart disease Other    Heart attack Other    Stroke Other    Hypertension Other     Social History Social History   Tobacco Use   Smoking status: Never   Smokeless tobacco: Never  Vaping Use   Vaping status: Never Used  Substance Use Topics   Alcohol use: No   Drug use: No      Allergies   Patient has no known allergies.   Review of Systems Review of Systems   Physical Exam Triage Vital Signs ED Triage Vitals  Encounter Vitals Group     BP 01/31/23 1129 103/72     Systolic BP Percentile --      Diastolic BP Percentile --      Pulse Rate 01/31/23 1129 78     Resp 01/31/23 1129 18     Temp 01/31/23 1129 98.4 F (36.9 C)     Temp Source 01/31/23 1129 Oral     SpO2 01/31/23 1129 99 %     Weight 01/31/23 1128 130 lb (59 kg)     Height 01/31/23 1128 5\' 6"  (1.676 m)     Head Circumference --      Peak Flow --      Pain Score 01/31/23 1127 10     Pain Loc --      Pain Education --      Exclude from Growth Chart --    No data found.  Updated Vital Signs BP 103/72 (BP Location: Left Arm)   Pulse 78   Temp 98.4 F (36.9 C) (Oral)   Resp 18   Ht 5\' 6"  (1.676 m)   Wt 130 lb (59 kg)   LMP 01/29/2023 (Exact Date)   SpO2 99%   BMI 20.98 kg/m   Visual Acuity Right Eye Distance:   Left Eye Distance:   Bilateral Distance:    Right Eye Near:   Left Eye Near:    Bilateral Near:     Physical Exam Constitutional:      General: She is not in acute distress.    Appearance: She is normal weight.  HENT:     Head: Normocephalic and atraumatic.  Cardiovascular:     Rate and Rhythm: Normal rate.     Pulses: Normal pulses.  Pulmonary:     Effort: Pulmonary effort is normal.  Musculoskeletal:        General: Normal range of motion.     Cervical back: Normal range of motion.  Skin:    General: Skin is warm and dry.     Capillary Refill: Capillary refill takes less than 2 seconds.  Neurological:     General: No focal deficit present.     Mental Status: She is alert and oriented to person, place, and time.  Psychiatric:        Mood and Affect: Mood normal.        Behavior: Behavior normal.      UC Treatments / Results  Labs (all labs ordered are listed, but  only abnormal results are displayed) Labs Reviewed - No data to  display  EKG   Radiology No results found.  Procedures Procedures (including critical care time)  Medications Ordered in UC Medications  alum & mag hydroxide-simeth (MAALOX/MYLANTA) 200-200-20 MG/5ML suspension 30 mL (30 mLs Oral Given 01/31/23 1200)  lidocaine (XYLOCAINE) 2 % viscous mouth solution 15 mL (15 mLs Mouth/Throat Given 01/31/23 1200)    Initial Impression / Assessment and Plan / UC Course  I have reviewed the triage vital signs and the nursing notes.  Pertinent labs & imaging results that were available during my care of the patient were reviewed by me and considered in my medical decision making (see chart for details).     Chest discomfort Final Clinical Impressions(s) / UC Diagnoses   Final diagnoses:  Chest discomfort  Gastroesophageal reflux disease, unspecified whether esophagitis present     Discharge Instructions      Due to your symptoms and our inability to provide you with an barium swallow study or ultrasound  .  You have been encouraged to go to the ED due to your 10 out of 10 pain.  You have declined the GI cocktail because you are wanting a more definitive diagnosis.  You have had some relief with the GI cocktail, Mylanta and lidocaine. You were able to consume water without discomfort. The recommendation is to continue to advance your diet slowly.  You have been provided with information on gastroesophageal reflux disease possible triggers and additional treatment options. However if her symptoms persist or get worse you are encouraged to proceed to the emergency department for further evaluation as previously discussed.     ED Prescriptions   None    PDMP not reviewed this encounter.   Barbette Merino, NP 01/31/23 1256

## 2023-01-31 NOTE — Discharge Instructions (Addendum)
Due to your symptoms and our inability to provide you with an barium swallow study or ultrasound  .  You have been encouraged to go to the ED due to your 10 out of 10 pain.  You have declined the GI cocktail because you are wanting a more definitive diagnosis.  You have had some relief with the GI cocktail, Mylanta and lidocaine. You were able to consume water without discomfort. The recommendation is to continue to advance your diet slowly.  You have been provided with information on gastroesophageal reflux disease possible triggers and additional treatment options. However if her symptoms persist or get worse you are encouraged to proceed to the emergency department for further evaluation as previously discussed.

## 2023-01-31 NOTE — ED Triage Notes (Signed)
Pt c/o CP since Wednesday midnight. States she thought it was gas, but swallowing makes it worse, "if I belch I feel it." States she just finished doxy, unsure if related. Called PCP, advised to go to UC. Given GI cocktail w no relief, so here for further eval.

## 2023-01-31 NOTE — Telephone Encounter (Signed)
  Chief Complaint: cough Symptoms: non productive cough, feels like mucus stuck in her throat Pertinent Negatives: Patient denies fever, sob, chest pain Disposition: [] ED /[x] Urgent Care (no appt availability in office) / [] Appointment(In office/virtual)/ []  [place] [facility]/ [] Home Care/ [] Refused Recommended Disposition /[] [facility]/ []  Follow-up with PCP Additional Notes: Patient reports she was diagnosed with bronchitis last week and has since completed her antibiotics. Patient reports she is still experiencing nonproductive cough and feels like there is mucus stuck in her throat. Patient reports she read online that the antibiotic she was on can cause esophagitis and is concerned that this might be why she feels like there is mucus in her throat. This RN attempted to schedule in office appt, no in office available, so appt scheduled at [facility] today 12/20. Patient advised to call back if anything worsens before UC appt. Patient verbalized understanding.    Copied from CRM [id number]. Topic: Clinical - Red Word Triage >> Jan 31, 2023  8:37 AM Elizebeth Brooking wrote: Red Word that prompted transfer to Nurse Triage: Heaviness in chest , hard to swallow Reason for Disposition  Cough has been present for > 3 weeks  Answer Assessment - Initial Assessment Questions 1. ONSET: "When did the cough begin?"      1 week ago 2. SEVERITY: "How bad is the cough today?"      moderate 3. SPUTUM: "Describe the color of your sputum" (none, dry cough; clear, white, yellow, green)     no 4. HEMOPTYSIS: "Are you coughing up any blood?" If so ask: "How much?" (flecks, streaks, tablespoons, etc.)     no 5. DIFFICULTY BREATHING: "Are you having difficulty breathing?" If Yes, ask: "How bad is it?" (e.g., mild, moderate, severe)    - MILD: No SOB at rest, mild SOB with walking, speaks normally in sentences, can lie down, no retractions, pulse < 100.    - MODERATE: SOB at rest, SOB with minimal exertion and  prefers to sit, cannot lie down flat, speaks in phrases, mild retractions, audible wheezing, pulse 100-120.    - SEVERE: Very SOB at rest, speaks in single words, struggling to breathe, sitting hunched forward, retractions, pulse > 120      mild 6. FEVER: "Do you have a fever?" If Yes, ask: "What is your temperature, how was it measured, and when did it start?"     no 7. CARDIAC HISTORY: "Do you have any history of heart disease?" (e.g., heart attack, congestive heart failure)      N/a 8. LUNG HISTORY: "Do you have any history of lung disease?"  (e.g., pulmonary embolus, asthma, emphysema)     Recent bronchitis 9. PE RISK FACTORS: "Do you have a history of blood clots?" (or: recent major surgery, recent prolonged travel, bedridden)     no 10. OTHER SYMPTOMS: "Do you have any other symptoms?" (e.g., runny nose, wheezing, chest pain)       Feels like mucus in chest,  Protocols used: Cough - Acute Non-Productive-A-AH

## 2023-01-31 NOTE — Telephone Encounter (Signed)
Spoke with Patient is being seen at Urgent care, the provider is sending patient to been seen in the ER, feels like patient would benefit from EGD

## 2023-01-31 NOTE — ED Triage Notes (Signed)
Pt presents with chest discomfort "that comes and goes, mainly when I am swallowing foods or liquids, wakes me up during the night as well." Pt currently rates her pain a 10/10. Pt diagnosed with bronchitis and took all prescribed medications recently. Pt states she took doxycycline and "other OTC medications." Pt is still reporting dry cough and is concerned the antibiotic may have caused the chest discomfort.

## 2023-01-31 NOTE — ED Provider Notes (Signed)
[place] EMERGENCY DEPARTMENT AT [facility] Provider Note   CSN: [id number] Arrival date & time: 01/31/23  1801     History  Chief Complaint  Patient presents with   Chest Pain    Pamela Holland is a 47 y.o. female.  Patient presents to the emergency department for evaluation of chest pain.  Symptoms started 3 days ago.  She describes pain with swallowing, pressure with swallowing in the mid chest, feels like food getting stuck in her throat, but no regurgitation.  Recently she has had cough which was treated as bronchitis with doxycycline.  She has finished a course of this.  She continues to have coughing.  She denies history of hypertension, high cholesterol, diabetes.  She does have family history, father with history of MI.  Patient denies reflux symptoms.  She does get spasms of intense pain from time to time in the throat area.  She was seen at urgent care, given a GI cocktail which did not really help, and she was referred to the emergency department for further evaluation per her report.       Home Medications Prior to Admission medications   Medication Sig Start Date End Date Taking? Authorizing Provider  doxycycline (VIBRAMYCIN) 100 MG capsule Take 1 capsule (100 mg total) by mouth 2 (two) times daily. 01/21/23   Burchette, Elberta Fortis, MD  ELDERBERRY PO Take by mouth.    [provider]  ferrous sulfate 325 (65 FE) MG tablet Take 1 tablet (325 mg total) by mouth daily. 12/07/13   Junious Silk, PA-C  fluconazole (DIFLUCAN) 150 MG tablet Take 1 tablet (150 mg total) by mouth every 3 (three) days. 01/01/23     gabapentin (NEURONTIN) 100 MG capsule Take 100 mg by mouth 3 (three) times daily.    [provider]  ibuprofen (ADVIL) 800 MG tablet Take 1 tablet (800 mg) by mouth every 12 hours as needed for cramping. 08/05/22   Deeann Saint, MD  metroNIDAZOLE (FLAGYL) 500 MG tablet Take 1 tablet (500 mg total) by mouth 2 (two) times daily as  directed for 7 days 01/01/23     Multiple Vitamins-Minerals (WOMENS MULTI PO) Take by mouth.    [provider]  sulfamethoxazole-trimethoprim (BACTRIM) 400-80 MG tablet Take 1 tablet by mouth after intercourse 09/30/22     valACYclovir (VALTREX) 500 MG tablet Take 1 tablet (500 mg total) by mouth daily for 30 days. 07/16/22         Allergies    Patient has no known allergies.    Review of Systems   Review of Systems  Physical Exam Updated Vital Signs BP (!) 140/87   Pulse 75   Temp 98 F (36.7 C)   Resp 16   Ht 5\' 6"  (1.676 m)   Wt 59 kg   LMP 01/29/2023 (Exact Date)   SpO2 100%   BMI 20.99 kg/m  Physical Exam Vitals and nursing note reviewed.  Constitutional:      Appearance: She is well-developed. She is not diaphoretic.  HENT:     Head: Normocephalic and atraumatic.     Mouth/Throat:     Mouth: Mucous membranes are not dry.  Eyes:     Conjunctiva/sclera: Conjunctivae normal.  Neck:     Vascular: Normal carotid pulses. No JVD.     Trachea: Trachea normal. No tracheal deviation.  Cardiovascular:     Rate and Rhythm: Normal rate and regular rhythm.     Pulses: No decreased pulses.  Radial pulses are 2+ on the right side and 2+ on the left side.     Heart sounds: Normal heart sounds, S1 normal and S2 normal. No murmur heard. Pulmonary:     Effort: Pulmonary effort is normal. No respiratory distress.     Breath sounds: No wheezing.  Chest:     Chest wall: No tenderness.  Abdominal:     General: Bowel sounds are normal.     Palpations: Abdomen is soft.     Tenderness: There is no abdominal tenderness. There is no guarding or rebound.  Musculoskeletal:        General: Normal range of motion.     Cervical back: Normal range of motion and neck supple. No muscular tenderness.  Skin:    General: Skin is warm and dry.     Coloration: Skin is not pale.  Neurological:     Mental Status: She is alert.     ED Results / Procedures / Treatments    Labs (all labs ordered are listed, but only abnormal results are displayed) Labs Reviewed  BASIC METABOLIC PANEL  CBC  PREGNANCY, URINE  TROPONIN I (HIGH SENSITIVITY)    EKG None  Radiology DG Chest 2 View Result Date: 01/31/2023 CLINICAL DATA:  Chest pain. EXAM: CHEST - 2 VIEW COMPARISON:  December 07, 2013 FINDINGS: The heart size and mediastinal contours are within normal limits. Both lungs are clear. The visualized skeletal structures are unremarkable. IMPRESSION: No active cardiopulmonary disease. Electronically Signed   By: Aram Candela M.D.   On: 01/31/2023 20:09    Procedures Procedures    Medications Ordered in ED Medications  pantoprazole (PROTONIX) EC tablet 40 mg (has no administration in time range)    ED Course/ Medical Decision Making/ A&P    Patient seen and examined. History obtained directly from patient. Work-up including labs, imaging, EKG ordered in triage, if performed, were reviewed.    Labs/EKG: Independently reviewed and interpreted.  This included: CBC unremarkable; BMP unremarkable; troponin normal at less than 2.  EKG personally reviewed and interpreted, shows nonspecific changes, poor baseline.  Imaging: Independently visualized and interpreted.  This included: Chest x-ray, agree negative for pneumonia  Medications/Fluids: Ordered: P.o. Protonix  Most recent vital signs reviewed and are as follows: BP 136/88   Pulse 71   Temp 98 F (36.7 C)   Resp 19   Ht 5\' 6"  (1.676 m)   Wt 59 kg   LMP 01/29/2023 (Exact Date)   SpO2 100%   BMI 20.99 kg/m   Initial impression: Painful swallowing and associated chest pain, concerning for gastritis, esophagitis.  Cardiac workup is reassuring.  Vital signs reassuring.  Plan: Discharge to home.   Prescriptions written for: Protonix, Pepcid, Carafate  Other home care instructions discussed: Focus on maintaining good hydration, bland/brat diet  ED return instructions discussed: Return and  follow-up instructions: I encouraged patient to return to ED with severe chest pain, especially if the pain is crushing or pressure-like and spreads to the arms, back, neck, or jaw, or if they have associated sweating, vomiting, or shortness of breath with the pain, or significant pain with activity. We discussed that the evaluation here today indicates a low-risk of serious cause of chest pain, including heart trouble or a blood clot, but no evaluation is perfect and chest pain can evolve with time. The patient verbalized understanding and agreed.  I encouraged patient to follow-up with their provider in the next 72 hours for recheck.  Medical Decision Making Amount and/or Complexity of Data Reviewed Labs: ordered. Radiology: ordered.  Risk Prescription drug management.   For this patient's complaint of chest pain, the following emergent conditions were considered on the differential diagnosis: acute coronary syndrome, pulmonary embolism, pneumothorax, myocarditis, pericardial tamponade, aortic dissection, thoracic aortic aneurysm complication, esophageal perforation.   Other causes were also considered including: gastroesophageal reflux disease, musculoskeletal pain including costochondritis, pneumonia/pleurisy, herpes zoster, pericarditis.  In regards to possibility of ACS, patient has atypical features of pain, non-ischemic and unchanged EKG and negative troponin(s). Heart score was calculated to be 2.   In regards to possibility of PE, symptoms are atypical for PE and risk profile is low, making PE low likelihood.  Reassuring vitals, no clinical signs and symptoms of a DVT.  Symptoms are definitely worse with swallowing.  Concerning for esophagitis or gastritis overall.  Will empirically treat with PPI, H2 blocker, Carafate.  GI referral given.  Remainder of workup is very reassuring at this time.  The patient's vital signs, pertinent lab work and  imaging were reviewed and interpreted as discussed in the ED course. Hospitalization was considered for further testing, treatments, or serial exams/observation. However as patient is well-appearing, has a stable exam, and reassuring studies today, I do not feel that they warrant admission at this time. This plan was discussed with the patient who verbalizes agreement and comfort with this plan and seems reliable and able to return to the Emergency Department with worsening or changing symptoms.           Final Clinical Impression(s) / ED Diagnoses Final diagnoses:  Precordial pain  Odynophagia    Rx / DC Orders ED Discharge Orders          Ordered    pantoprazole (PROTONIX) 40 MG tablet  Daily        01/31/23 2038    famotidine (PEPCID) 20 MG tablet  2 times daily        01/31/23 2038    sucralfate (CARAFATE) 1 g tablet  3 times daily with meals & bedtime        01/31/23 2038              Renne Crigler, PA-C 01/31/23 2040    Rolan Bucco, MD 01/31/23 2340

## 2023-02-01 ENCOUNTER — Other Ambulatory Visit (HOSPITAL_COMMUNITY): Payer: Self-pay

## 2023-02-03 ENCOUNTER — Ambulatory Visit: Payer: 59 | Admitting: Family Medicine

## 2023-02-03 ENCOUNTER — Encounter: Payer: Self-pay | Admitting: Family Medicine

## 2023-02-03 ENCOUNTER — Other Ambulatory Visit (HOSPITAL_COMMUNITY): Payer: Self-pay

## 2023-02-03 VITALS — BP 102/58 | HR 92 | Temp 98.2°F | Wt 125.0 lb

## 2023-02-03 DIAGNOSIS — T50905A Adverse effect of unspecified drugs, medicaments and biological substances, initial encounter: Secondary | ICD-10-CM

## 2023-02-03 DIAGNOSIS — K208 Other esophagitis without bleeding: Secondary | ICD-10-CM | POA: Diagnosis not present

## 2023-02-03 MED ORDER — FAMOTIDINE 20 MG PO TABS
20.0000 mg | ORAL_TABLET | Freq: Two times a day (BID) | ORAL | 0 refills | Status: AC
  Filled 2023-02-03: qty 60, 30d supply, fill #0

## 2023-02-03 NOTE — Progress Notes (Signed)
Pamela Holland , 1975-09-14, 47 y.o., female MRN: [id number] Patient Care Team    Relationship Specialty Notifications Start End  Deeann Saint, MD PCP - General Family Medicine  12/11/22   [facility]    08/05/22    Comment: Dr Loreta Ave    Chief Complaint  Patient presents with   Gastroesophageal Reflux    Brat diet; will call GI to schedule     Subjective: Pamela Holland is a 47 y.o. Pt presents for an OV for ED follow up completed 01/31/2023 for 3 day h/o chest pain.  Pt had complained of swallowing pressure in her mid-chest that had occurred abruptly in the middle of the night. She did have a recent abx- doxycycline for bronchitis.  Gi cocktail not helpful at the urgent care and patient was encouraged to go to emergency room. Cardiac workup was normal. Cxr: clear CBC unremarkable; BMP unremarkable; troponin normal at less than 2.  EKG- nonspecific changes, poor baseline.  Treated with Protonix, pepcid and carafate. Patient reports she has seen some improvement already in the 3 days she has been taking the medication, but she still having some discomfort mid chest. ED had placed the referral to [facility]-Dr. Dulce Sellar and she is calling today to schedule with him.  She is avoiding spicy foods and eating a bland diet.     01/15/2023   10:35 AM 08/05/2022    4:18 PM 10/14/2017    9:25 AM  Depression screen PHQ 2/9  Decreased Interest 0 0 0  Down, Depressed, Hopeless 0 0 0  PHQ - 2 Score 0 0 0  Altered sleeping  0   Tired, decreased energy  2   Change in appetite  0   Feeling bad or failure about yourself   0   Trouble concentrating  0   Moving slowly or fidgety/restless  0   Suicidal thoughts  0   PHQ-9 Score  2   Difficult doing work/chores  Not difficult at all     No Known Allergies Social History   Social History Narrative   Not on file   Past Medical History:  Diagnosis Date   Palpitations    Past Surgical History:  Procedure  Laterality Date   CYSTECTOMY     DILATATION & CURETTAGE/HYSTEROSCOPY WITH MYOSURE N/A 03/09/2021   Procedure: DILATATION & CURETTAGE/HYSTEROSCOPY WITH MYOSURE RESECTION OF MYOMA;  Surgeon: Lavina Hamman, MD;  Location: [facility];  Service: Gynecology;  Laterality: N/A;   UTERINE FIBROID SURGERY     WISDOM TOOTH EXTRACTION     Family History  Problem Relation Age of Onset   Heart disease Mother    Hypertension Mother    Diabetes Mother    Arthritis Mother    High Cholesterol Mother    Kidney disease Mother    Cancer Father        pancreatic   Hypertension Father    Heart attack Father    Heart disease Other    Heart attack Other    Stroke Other    Hypertension Other    Allergies as of 02/03/2023   No Known Allergies      Medication List        Accurate as of February 03, 2023  2:48 PM. If you have any questions, ask your nurse or doctor.          STOP taking these medications    fluconazole 150 MG tablet Commonly known  as: DIFLUCAN Stopped by: Felix Pacini   metroNIDAZOLE 500 MG tablet Commonly known as: FLAGYL Stopped by: Felix Pacini       TAKE these medications    ELDERBERRY PO Take by mouth.   famotidine 20 MG tablet Commonly known as: PEPCID Take 1 tablet (20 mg total) by mouth 2 (two) times daily.   ferrous sulfate 325 (65 FE) MG tablet Take 1 tablet (325 mg total) by mouth daily.   gabapentin 100 MG capsule Commonly known as: NEURONTIN Take 100 mg by mouth 3 (three) times daily.   ibuprofen 800 MG tablet Commonly known as: ADVIL Take 1 tablet (800 mg) by mouth every 12 hours as needed for cramping.   pantoprazole 40 MG tablet Commonly known as: Protonix Take 1 tablet (40 mg total) by mouth daily.   sucralfate 1 g tablet Commonly known as: Carafate Take 1 tablet (1 g total) by mouth 4 (four) times daily -  with meals and at bedtime.   sulfamethoxazole-trimethoprim 400-80 MG tablet Commonly known as: BACTRIM Take  1 tablet by mouth after intercourse   valACYclovir 500 MG tablet Commonly known as: VALTREX Take 1 tablet (500 mg total) by mouth daily for 30 days.   WOMENS MULTI PO Take by mouth.        All past medical history, surgical history, allergies, family history, immunizations andmedications were updated in the EMR today and reviewed under the history and medication portions of their EMR.     ROS Negative, with the exception of above mentioned in HPI   Objective:  BP (!) 102/58   Pulse 92   Temp 98.2 F (36.8 C)   Wt 125 lb (56.7 kg)   LMP 01/29/2023 (Exact Date)   SpO2 98%   BMI 20.18 kg/m  Body mass index is 20.18 kg/m. Physical Exam Vitals and nursing note reviewed.  Constitutional:      General: She is not in acute distress.    Appearance: Normal appearance. She is normal weight. She is not ill-appearing or toxic-appearing.  HENT:     Head: Normocephalic and atraumatic.  Eyes:     General: No scleral icterus.       Right eye: No discharge.        Left eye: No discharge.     Extraocular Movements: Extraocular movements intact.     Conjunctiva/sclera: Conjunctivae normal.     Pupils: Pupils are equal, round, and reactive to light.  Cardiovascular:     Rate and Rhythm: Normal rate.  Pulmonary:     Effort: Pulmonary effort is normal.  Abdominal:     General: Abdomen is flat. There is no distension.     Palpations: Abdomen is soft.     Tenderness: There is no abdominal tenderness. There is no guarding or rebound.  Skin:    Findings: No rash.  Neurological:     Mental Status: She is alert and oriented to person, place, and time. Mental status is at baseline.     Motor: No weakness.     Coordination: Coordination normal.     Gait: Gait normal.  Psychiatric:        Mood and Affect: Mood normal.        Behavior: Behavior normal.        Thought Content: Thought content normal.        Judgment: Judgment normal.      No results found. No results found. No  results found for this or any previous visit (from the  past 24 hours).  Assessment/Plan: Pamela Holland is a 47 y.o. female present for OV for  Pill esophagitis (Primary) Suspect pill esophagitis from her doxycycline.  Symptoms occurred rather abruptly in the middle of the night after completing her doxycycline course. Encouraged her to continue Protonix daily, Pepcid twice daily for 4 weeks Continue Carafate 3 times daily before meals and nightly for 2-4 weeks. She can slowly attempt to advance diet last bland, still avoiding very spicy foods.  Patient reports understanding Follow-up with GI, if not improving EGD may be warranted.  Reviewed expectations re: course of current medical issues. Discussed self-management of symptoms. Outlined signs and symptoms indicating need for more acute intervention. Patient verbalized understanding and all questions were answered. Patient received an After-Visit Summary.    No orders of the defined types were placed in this encounter.  No orders of the defined types were placed in this encounter.  Referral Orders  No referral(s) requested today     Note is dictated utilizing voice recognition software. Although note has been proof read prior to signing, occasional typographical errors still can be missed. If any questions arise, please do not hesitate to call for verification.   electronically signed by:  Felix Pacini, DO  [facility] [place]

## 2023-02-03 NOTE — Patient Instructions (Addendum)
Return in about 2 weeks (around 02/17/2023), or if symptoms worsen or fail to improve, for with pcp.        Great to see you today.  I have refilled the medication(s) we provide.   If labs were collected or images ordered, we will inform you of  results once we have received them and reviewed. We will contact you either by echart message, or telephone call.  Please give ample time to the testing facility, and our office to run,  receive and review results. Please do not call inquiring of results, even if you can see them in your chart. We will contact you as soon as we are able. If it has been over 1 week since the test was completed, and you have not yet heard from [place], then please call us.    - echart message- for normal results that have been seen by the patient already.   - telephone call: abnormal results or if patient has not viewed results in their echart.  If a referral to a specialist was entered for you, please call us in 2 weeks if you have not heard from the specialist office to schedule.    Esophagitis  Esophagitis is inflammation of the esophagus. The esophagus is the tube that carries food from the mouth to the stomach. Esophagitis can cause soreness or pain in the esophagus. This condition can make it difficult and painful to swallow. What are the causes? Most causes of esophagitis are not serious. Common causes of this condition include: Gastroesophageal reflux disease (GERD). This is when stomach contents move back up into the esophagus (reflux). Repeated vomiting. An allergic reaction, especially caused by food allergies (eosinophilic esophagitis). Injury to the esophagus by swallowing large pills with or without water, or swallowing certain types of medicines. Swallowing harmful chemicals, such as household cleaning products. Drinking a lot of alcohol. An infection of the esophagus. This most often occurs in people who have a weakened immune system. Radiation or  chemotherapy treatment for cancer. Certain diseases such as sarcoidosis, Crohn's disease, and scleroderma. What are the signs or symptoms? Symptoms of this condition include: Difficult or painful swallowing. Pain with swallowing acidic liquids, such as citrus juices. You may also have pain when you burp. Chest pain and difficulty breathing. Nausea and vomiting. Pain in the abdomen. Weight loss. Ulcers in the mouth and white patches in the mouth (candidiasis). Fever. Coughing up blood or vomiting blood. Stool that is black, tarry, or bright red. How is this diagnosed? This condition may be diagnosed based on your medical history and a physical exam. You may also have other tests, including: A test to examine your esophagus and stomach with a small flexible tube with a camera (endoscopy). A test that measures the acidity level in your esophagus. A test that measures how much pressure is on your esophagus. A barium swallow or modified barium swallow to show the shape, size, and functioning of your esophagus. Allergy tests. How is this treated? Treatment for this condition depends on the cause of your esophagitis. In some cases, steroids or other medicines may be given to help relieve your symptoms or to treat the underlying cause of your condition. You may have to make some lifestyle changes, such as: Avoiding alcohol. Quitting any products that contain nicotine or tobacco. These products include cigarettes, chewing tobacco, and vaping devices, such as e-cigarettes. If you need help quitting, ask your health care provider. Changing your diet. Exercising. Changing your sleep habits and  your sleep environment. Follow these instructions at home: Medicines Take over-the-counter and prescription medicines only as told by your health care provider. Do not take aspirin, ibuprofen, or other NSAIDs unless your health care provider told you to do so. If you have trouble taking pills: Use a pill  splitter to decrease the size of the pill. This will decrease the chance of the pill getting stuck or injuring your esophagus. Drink water after you take a pill. Eating and drinking  Avoid foods and drinks that seem to make your symptoms worse. Follow a diet as recommended by your health care provider. This may involve avoiding foods and drinks such as: Coffee and tea, with or without caffeine. Drinks that contain alcohol. Energy drinks and sports drinks. Carbonated drinks or sodas. Chocolate and cocoa. Peppermint and mint flavorings. Garlic and onions. Horseradish. Spicy and acidic foods, including peppers, chili powder, curry powder, vinegar, hot sauces, and barbecue sauce. Citrus fruit juices and citrus fruits, such as oranges, lemons, and limes. Tomato-based foods, such as red sauce, chili, salsa, and pizza with red sauce. Fried and fatty foods, such as donuts, french fries, potato chips, and high-fat dressings. High-fat meats, such as hot dogs and fatty cuts of red and white meats, such as rib eye steak, sausage, ham, and bacon. High-fat dairy items, such as whole milk, butter, and cream cheese. Lifestyle Eat small, frequent meals instead of large meals. Avoid drinking large amounts of liquid with your meals. Avoid eating meals during the 2-3 hours before bedtime. Avoid lying down right after you eat. Do not exercise right after you eat. Do not use any products that contain nicotine or tobacco. These products include cigarettes, chewing tobacco, and vaping devices, such as e-cigarettes. If you need help quitting, ask your health care provider. General instructions  Pay attention to any changes in your symptoms. Let your health care provider know about them. Wear loose-fitting clothing. Do not wear anything tight around your waist that causes pressure on your abdomen. Raise (elevate) the head of your bed about 6 inches (15 cm). You may need to use a wedge to do this. Try  relaxation strategies such as yoga, deep breathing, or meditation to manage stress. If you need help reducing stress, ask your health care provider. If you are overweight, reduce your weight to an amount that is healthy for you. Ask your health care provider for guidance about a safe weight loss goal. Keep all follow-up visits. This is important. Contact a health care provider if: You have new symptoms. You have unexplained weight loss. You have difficulty swallowing, or it hurts to swallow. You have wheezing or a cough that does not go away. Your symptoms do not improve with treatment. You have frequent heartburn for more than two weeks. Get help right away if: You have sudden severe pain in your arms, neck, jaw, teeth, or back. You suddenly feel sweaty, dizzy, or light-headed. You have chest pain or shortness of breath. You vomit and the vomit is green, yellow, or black, or it looks like blood or coffee grounds. Your stool is red, bloody, or black. You have a fever. You cannot swallow, drink, or eat. These symptoms may represent a serious problem that is an emergency. Do not wait to see if the symptoms will go away. Get medical help right away. Call your local emergency services (911 in the [place] Do not drive yourself to the hospital. Summary Esophagitis is inflammation of the esophagus. Most causes of esophagitis are not serious.  Follow your health care provider's instructions about eating and drinking. Contact a health care provider if you have new symptoms, have weight loss, or coughing that does not stop. Get help right away if you have severe pain in the arms, neck, jaw, teeth, or back, or if you have chest pain, shortness of breath, or fever. This information is not intended to replace advice given to you by your health care provider. Make sure you discuss any questions you have with your health care provider. Document Revised: 08/09/2019 Document Reviewed: 08/09/2019 Elsevier  Patient Education  2024 [employer].

## 2023-02-06 DIAGNOSIS — Z1211 Encounter for screening for malignant neoplasm of colon: Secondary | ICD-10-CM | POA: Diagnosis not present

## 2023-02-06 DIAGNOSIS — R1310 Dysphagia, unspecified: Secondary | ICD-10-CM | POA: Diagnosis not present

## 2023-02-06 DIAGNOSIS — R634 Abnormal weight loss: Secondary | ICD-10-CM | POA: Diagnosis not present

## 2023-02-21 ENCOUNTER — Other Ambulatory Visit (HOSPITAL_COMMUNITY): Payer: Self-pay

## 2023-02-25 DIAGNOSIS — J069 Acute upper respiratory infection, unspecified: Secondary | ICD-10-CM | POA: Diagnosis not present

## 2023-02-26 ENCOUNTER — Encounter: Payer: Self-pay | Admitting: Family Medicine

## 2023-02-26 ENCOUNTER — Ambulatory Visit: Payer: 59 | Admitting: Family Medicine

## 2023-02-26 ENCOUNTER — Ambulatory Visit: Payer: Self-pay | Admitting: Family Medicine

## 2023-02-26 VITALS — BP 120/80 | HR 90 | Temp 98.8°F | Wt 126.2 lb

## 2023-02-26 DIAGNOSIS — J069 Acute upper respiratory infection, unspecified: Secondary | ICD-10-CM | POA: Diagnosis not present

## 2023-02-26 NOTE — Telephone Encounter (Signed)
 Copied from CRM [id number]. Topic: Clinical - Pink Word Triage >> Feb 26, 2023  9:05 AM Marlan Silva wrote: Reason for Triage: Patient states that she went to [facility] last night with sinus infection symptoms since last Monday. Patient states she was coughing a lot, and was diagnosed with upper respiratory infection by a physician @ [facility]. 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 Patient is scheduled for today to see Dr. Darren Em.  Patient denies any new symptoms since being seen yesterday. Confirmed appointment later today with patient. Patient would like tested for RSV due to having multiple cases at her job.

## 2023-02-26 NOTE — Telephone Encounter (Signed)
 Will discuss at appt today.  Marquetta Sit MD [facility]

## 2023-02-26 NOTE — Progress Notes (Signed)
 Established Patient Office Visit  Subjective   Patient ID: Pamela Holland, female    DOB: [birth year]  Age: 48 y.o. MRN: [id number]  Chief Complaint  Patient presents with   Sinusitis   Cough    HPI   Pamela Holland is seen with 1 week history of upper respiratory symptoms.  She has had cough, sore throat, malaise.  No fever or chills.  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  She states they have had 7 cases of RSV.  She was concerned she may have RSV.  She has not noted any wheezing.  No dyspnea.  No purulent nasal secretions.  Sleeping reasonably well at night.  She was seen at urgent care with [facility] yesterday and diagnosed with viral infection.  Nonfocal exam.  No significant changes since then symptomatically.  No chronic lung disease.  Past Medical History:  Diagnosis Date   Palpitations    Past Surgical History:  Procedure Laterality Date   CYSTECTOMY     DILATATION & CURETTAGE/HYSTEROSCOPY WITH MYOSURE N/A 03/09/2021   Procedure: DILATATION & CURETTAGE/HYSTEROSCOPY WITH MYOSURE RESECTION OF MYOMA;  Surgeon: Cyd Dowse, MD;  Location: [facility];  Service: Gynecology;  Laterality: N/A;   UTERINE FIBROID SURGERY     WISDOM TOOTH EXTRACTION      reports that she has never smoked. She has never used smokeless tobacco. She reports that she does not drink alcohol and does not use drugs. family history includes Arthritis in her mother; Cancer in her father; Diabetes in her mother; Heart attack in her father and another family member; Heart disease in her mother and another family member; High Cholesterol in her mother; Hypertension in her father, mother, and another family member; Kidney disease in her mother; Stroke in an other family member. No Known Allergies  Review of Systems  Constitutional:  Negative for chills and fever.  HENT:  Positive for congestion and sore throat.   Respiratory:  Positive for cough.   Cardiovascular:  Negative for chest  pain.      Objective:     BP 120/80 (BP Location: Left Arm, Patient Position: Sitting, Cuff Size: Normal)   Pulse 90   Temp 98.8 F (37.1 C) (Oral)   Wt 126 lb 3.2 oz (57.2 kg)   LMP 01/29/2023 (Exact Date)   SpO2 99%   BMI 20.37 kg/m  BP Readings from Last 3 Encounters:  02/26/23 120/80  02/03/23 (!) 102/58  01/31/23 (!) 142/83   Wt Readings from Last 3 Encounters:  02/26/23 126 lb 3.2 oz (57.2 kg)  02/03/23 125 lb (56.7 kg)  01/31/23 130 lb 1.1 oz (59 kg)      Physical Exam Vitals reviewed.  Constitutional:      General: She is not in acute distress.    Appearance: She is not ill-appearing.  HENT:     Right Ear: Tympanic membrane normal.     Left Ear: Tympanic membrane normal.     Mouth/Throat:     Mouth: Mucous membranes are moist.     Pharynx: Oropharynx is clear. No oropharyngeal exudate or posterior oropharyngeal erythema.  Cardiovascular:     Rate and Rhythm: Normal rate and regular rhythm.  Pulmonary:     Effort: Pulmonary effort is normal.     Breath sounds: Normal breath sounds. No wheezing or rales.  Musculoskeletal:     Cervical back: Neck supple.  Lymphadenopathy:     Cervical: No cervical adenopathy.  Neurological:     Mental Status:  She is alert.      No results found for any visits on 02/26/23.    The ASCVD Risk score (Arnett DK, et al., 2019) failed to calculate for the following reasons:   Cannot find a previous HDL lab   Cannot find a previous total cholesterol lab    Assessment & Plan:   Probable viral URI with cough.  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  However, she has no reactive airway on exam today and basically normal lung exam.  Suspect other viral.  We explained even if she had RSV treatment would be symptomatic.  Follow-up immediately for any fever.  We discussed importance of basic things in preventing infection with adequate sleep, good nutrition, regular handwashing.  Follow-up for any persistent or  worsening symptoms.   Glean Lamy, MD

## 2023-02-28 ENCOUNTER — Ambulatory Visit: Payer: Self-pay | Admitting: Family Medicine

## 2023-02-28 ENCOUNTER — Telehealth (INDEPENDENT_AMBULATORY_CARE_PROVIDER_SITE_OTHER): Payer: 59 | Admitting: Nurse Practitioner

## 2023-02-28 ENCOUNTER — Other Ambulatory Visit (HOSPITAL_COMMUNITY): Payer: Self-pay

## 2023-02-28 VITALS — Wt 126.0 lb

## 2023-02-28 DIAGNOSIS — J329 Chronic sinusitis, unspecified: Secondary | ICD-10-CM | POA: Insufficient documentation

## 2023-02-28 MED ORDER — AZITHROMYCIN 250 MG PO TABS
ORAL_TABLET | ORAL | 0 refills | Status: AC
  Filled 2023-02-28: qty 6, 5d supply, fill #0

## 2023-02-28 MED ORDER — PREDNISONE 20 MG PO TABS
40.0000 mg | ORAL_TABLET | Freq: Every day | ORAL | 0 refills | Status: AC
  Filled 2023-02-28: qty 10, 5d supply, fill #0

## 2023-02-28 NOTE — Progress Notes (Signed)
Established Patient Office Visit  An audio/visual tele-health visit was completed today for this patient. I connected with  Pamela Holland on 02/28/23 utilizing audio/visual technology and verified that I am speaking with the correct person using two identifiers. The patient was located at their home, and I was located at the office of [facility] at [facility] during the encounter. I discussed the limitations of evaluation and management by telemedicine. The patient expressed understanding and agreed to proceed.     Subjective   Patient ID: Pamela Holland, female    DOB: 03/22/1975  Age: 48 y.o. MRN: [id number]  Chief Complaint  Patient presents with   Cough   Patient has today for virtual visit for the above. Symptoms originally started a little bit over a week ago.  She has seen 2 other providers for symptoms and was diagnosed with viral upper respiratory infection.  She has been exposed to RSV multiple times at her job.  She has been managing her symptoms with Mucinex, Sudafed, hot tea, soups, Tylenol, with temporary relief.  She reports that symptoms continue to get progressively worse and is now including hoarseness.  She has sinus pressure and discomfort that is unilateral.  She reports that she had bronchitis about 3 weeks ago as well.     Review of Systems  Constitutional:  Negative for chills and fever.  HENT:  Positive for congestion and sinus pain.   Respiratory:  Positive for cough. Negative for wheezing.       Objective:     Wt 126 lb (57.2 kg)   LMP 01/29/2023 (Exact Date)   BMI 20.34 kg/m    Physical Exam Comprehensive physical exam not completed today as office visit was conducted remotely.  No acute respiratory distress noted, patient is quite hoarse on exam, and does cough a few times during the exam.  Patient was alert and oriented, and appeared to have appropriate judgment.   No results found for any visits on 02/28/23.    The ASCVD  Risk score (Arnett DK, et al., 2019) failed to calculate for the following reasons:   Cannot find a previous HDL lab   Cannot find a previous total cholesterol lab    Assessment & Plan:   Problem List Items Addressed This Visit       Respiratory   Sinusitis - Primary   Acute Likely started as RSV infection.  At this point I am wondering if secondary bacterial infection is starting to take place.  Per shared decision making we will initiate her on prednisone 40 mg a mouth daily x 5 days.  We talked about side effects and risks of the medication as well as benefits.  We discussed avoidance of all NSAIDs while taking prednisone.   I will also send in Z-Pak for patient that she can start over the weekend if her symptoms do not improve with prednisone.  We did discuss that viral infections do not respond to antibiotics, and unless there is a secondary bacterial infection the antibiotic will not provide relief and will just potentially cause her discomfort as GI upset and diarrhea are common side effects.  She reports her understanding. Patient to return to office in person if symptoms do not improve despite this treatment plan.      Relevant Medications   promethazine (PHENERGAN) 6.25 MG/5ML solution   azithromycin (ZITHROMAX) 250 MG tablet   predniSONE (DELTASONE) 20 MG tablet    Return if symptoms worsen or fail to  improve.    Elenore Paddy, NP

## 2023-02-28 NOTE — Assessment & Plan Note (Signed)
Acute Likely started as RSV infection.  At this point I am wondering if secondary bacterial infection is starting to take place.  Per shared decision making we will initiate her on prednisone 40 mg a mouth daily x 5 days.  We talked about side effects and risks of the medication as well as benefits.  We discussed avoidance of all NSAIDs while taking prednisone.   I will also send in Z-Pak for patient that she can start over the weekend if her symptoms do not improve with prednisone.  We did discuss that viral infections do not respond to antibiotics, and unless there is a secondary bacterial infection the antibiotic will not provide relief and will just potentially cause her discomfort as GI upset and diarrhea are common side effects.  She reports her understanding. Patient to return to office in person if symptoms do not improve despite this treatment plan.

## 2023-02-28 NOTE — Telephone Encounter (Signed)
  Chief Complaint: Cough, laryngitis Symptoms: Cough, laryngitis Frequency: Ongoing for about 2 wks Pertinent Negatives: Patient denies fever, SOB, N/V Disposition: [] ED /[] Urgent Care (no appt availability in office) / [x] Appointment(In office/virtual)/ []  [facility]/ [] Home Care/ [] Refused Recommended Disposition /[] [facility]/ []  Follow-up with PCP Additional Notes: Pt reports she was seen last Wednesday for OV for cough, congestion. She reports she was advised to let things run their course and was prescribed cough medicine for bedtime. 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 The cough and laryngitis are making it difficult for her to work. Pt denies CP, SOB, fever. She reports she is having productive cough, primarily in the AM producing dark green sputum that sometimes contains streaks of blood. Virtual OV scheduled today. This RN educated pt on new-worsening symptoms, when to call back/seek emergent care. Pt verbalized understanding and agrees to plan.   Copied from CRM [id number]. Topic: General - Other >> Feb 28, 2023 12:21 PM Dimitri Ped wrote: Reason for CRM: patient calling to speak with nurse and I was just here Wednesday and the doctor bruchette I seen needs to know I'm still not feeling well . He told me Enis Gash just let it run its course . 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 Still coughing up and still coughing throughout the day . laryngitis Reason for Disposition  [1] Continuous (nonstop) coughing interferes with work or school AND [2] no improvement using cough treatment per Care Advice  Answer Assessment - Initial Assessment Questions 1. ONSET: "When did the cough begin?"      About 2 weeks, started as sinus pressure 3. SPUTUM: "Describe the color of your sputum" (none, dry cough; clear, white, yellow, green)     Primarily in the AM, dark green 4. HEMOPTYSIS: "Are you  coughing up any blood?" If so ask: "How much?" (flecks, streaks, tablespoons, etc.)     Sometimes, streaks 5. DIFFICULTY BREATHING: "Are you having difficulty breathing?" If Yes, ask: "How bad is it?" (e.g., mild, moderate, severe)    - MILD: No SOB at rest, mild SOB with walking, speaks normally in sentences, can lie down, no retractions, pulse < 100.    - MODERATE: SOB at rest, SOB with minimal exertion and prefers to sit, cannot lie down flat, speaks in phrases, mild retractions, audible wheezing, pulse 100-120.    - SEVERE: Very SOB at rest, speaks in single words, struggling to breathe, sitting hunched forward, retractions, pulse > 120      None 6. FEVER: "Do you have a fever?" If Yes, ask: "What is your temperature, how was it measured, and when did it start?"     None 10. OTHER SYMPTOMS: "Do you have any other symptoms?" (e.g., runny nose, wheezing, chest pain)       Laryngitis since Tuesday  Protocols used: Cough - Acute Productive-A-AH

## 2023-03-03 ENCOUNTER — Other Ambulatory Visit (HOSPITAL_COMMUNITY): Payer: Self-pay

## 2023-03-03 MED ORDER — PEG 3350-KCL-NA BICARB-NACL 420 G PO SOLR
ORAL | 0 refills | Status: AC
  Filled 2023-03-03: qty 4000, 1d supply, fill #0

## 2023-03-03 MED ORDER — BISACODYL 5 MG PO TBEC
20.0000 mg | DELAYED_RELEASE_TABLET | Freq: Every day | ORAL | 0 refills | Status: AC
  Filled 2023-03-03: qty 4, 1d supply, fill #0

## 2023-03-17 DIAGNOSIS — K2930 Chronic superficial gastritis without bleeding: Secondary | ICD-10-CM | POA: Diagnosis not present

## 2023-03-17 DIAGNOSIS — K648 Other hemorrhoids: Secondary | ICD-10-CM | POA: Diagnosis not present

## 2023-03-17 DIAGNOSIS — K635 Polyp of colon: Secondary | ICD-10-CM | POA: Diagnosis not present

## 2023-03-17 DIAGNOSIS — R634 Abnormal weight loss: Secondary | ICD-10-CM | POA: Diagnosis not present

## 2023-03-17 DIAGNOSIS — Z1211 Encounter for screening for malignant neoplasm of colon: Secondary | ICD-10-CM | POA: Diagnosis not present

## 2023-03-17 LAB — HM COLONOSCOPY

## 2023-03-25 ENCOUNTER — Encounter: Payer: Self-pay | Admitting: Family Medicine

## 2023-05-22 ENCOUNTER — Other Ambulatory Visit (HOSPITAL_COMMUNITY): Payer: Self-pay

## 2023-05-22 DIAGNOSIS — N390 Urinary tract infection, site not specified: Secondary | ICD-10-CM | POA: Diagnosis not present

## 2023-05-22 MED ORDER — NITROFURANTOIN MACROCRYSTAL 100 MG PO CAPS
100.0000 mg | ORAL_CAPSULE | Freq: Two times a day (BID) | ORAL | 0 refills | Status: DC
  Filled 2023-05-22: qty 14, 7d supply, fill #0

## 2023-06-10 ENCOUNTER — Other Ambulatory Visit (HOSPITAL_COMMUNITY): Payer: Self-pay

## 2023-06-10 DIAGNOSIS — R102 Pelvic and perineal pain: Secondary | ICD-10-CM | POA: Diagnosis not present

## 2023-06-10 DIAGNOSIS — B9689 Other specified bacterial agents as the cause of diseases classified elsewhere: Secondary | ICD-10-CM | POA: Diagnosis not present

## 2023-06-10 DIAGNOSIS — R300 Dysuria: Secondary | ICD-10-CM | POA: Diagnosis not present

## 2023-06-10 DIAGNOSIS — N760 Acute vaginitis: Secondary | ICD-10-CM | POA: Diagnosis not present

## 2023-06-10 DIAGNOSIS — B3731 Acute candidiasis of vulva and vagina: Secondary | ICD-10-CM | POA: Diagnosis not present

## 2023-06-10 MED ORDER — FLUCONAZOLE 150 MG PO TABS
ORAL_TABLET | ORAL | 0 refills | Status: DC
  Filled 2023-06-10: qty 3, 9d supply, fill #0

## 2023-06-10 MED ORDER — METRONIDAZOLE 500 MG PO TABS
ORAL_TABLET | ORAL | 0 refills | Status: DC
  Filled 2023-06-10: qty 14, 7d supply, fill #0

## 2023-06-12 ENCOUNTER — Ambulatory Visit: Admitting: Family Medicine

## 2023-06-12 ENCOUNTER — Encounter: Payer: Self-pay | Admitting: Family Medicine

## 2023-06-12 VITALS — BP 118/72 | HR 100 | Temp 100.5°F | Wt 127.0 lb

## 2023-06-12 DIAGNOSIS — R519 Headache, unspecified: Secondary | ICD-10-CM

## 2023-06-12 DIAGNOSIS — R509 Fever, unspecified: Secondary | ICD-10-CM

## 2023-06-12 LAB — POC COVID19 BINAXNOW: SARS Coronavirus 2 Ag: NEGATIVE

## 2023-06-12 LAB — POCT RAPID STREP A (OFFICE): Rapid Strep A Screen: NEGATIVE

## 2023-06-12 NOTE — Progress Notes (Signed)
 Acute Office Visit   Subjective:  Patient ID: Pamela Holland, female    DOB: [birth year], 48 y.o.   MRN: [id number]  Chief Complaint  Patient presents with   Fever   Headache   Fatigue   Chills   Nausea    HPI:  Patient is having the following symptoms:  Fever (102.0 oral) Headache Fatigue Chills  Nausea Body Aches Decrease Appetite Nasal Drainage   Denies sore throat, ear pain/drainage, chest pain, SHOB, or vomiting.   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    Symptoms started this morning at 7am.   Patient has took Ibuprofen  800mg  prior to arrival.   Review of Systems  Constitutional:  Positive for fever.  Neurological:  Positive for headaches.   See HPI above      Objective:   BP 118/72   Pulse 100   Temp (!) 100.5 F (38.1 C) (Oral)   Wt 127 lb (57.6 kg)   SpO2 96%   BMI 20.50 kg/m    Physical Exam Vitals reviewed.  Constitutional:      General: She is not in acute distress.    Appearance: Normal appearance. She is well-developed and normal weight. She is ill-appearing. She is not toxic-appearing or diaphoretic.  HENT:     Head: Normocephalic and atraumatic.     Right Ear: Tympanic membrane, ear canal and external ear normal. There is no impacted cerumen.     Left Ear: Tympanic membrane, ear canal and external ear normal. There is no impacted cerumen.     Nose:     Right Sinus: No maxillary sinus tenderness or frontal sinus tenderness.     Left Sinus: No maxillary sinus tenderness or frontal sinus tenderness.     Mouth/Throat:     Pharynx: Oropharynx is clear. Uvula midline. No pharyngeal swelling, oropharyngeal exudate, posterior oropharyngeal erythema, uvula swelling or postnasal drip.  Eyes:     General:        Right eye: No discharge.        Left eye: No discharge.     Conjunctiva/sclera: Conjunctivae normal.  Cardiovascular:     Rate and Rhythm: Normal rate and regular rhythm.     Heart sounds: Normal  heart sounds. No murmur heard.    No friction rub. No gallop.  Pulmonary:     Effort: Pulmonary effort is normal. No respiratory distress.     Breath sounds: Normal breath sounds.  Musculoskeletal:        General: Normal range of motion.  Lymphadenopathy:     Head:     Right side of head: No submental or submandibular adenopathy.     Left side of head: No submental or submandibular adenopathy.  Skin:    General: Skin is warm and dry.  Neurological:     General: No focal deficit present.     Mental Status: She is alert and oriented to person, place, and time. Mental status is at baseline.  Psychiatric:        Mood and Affect: Mood normal.        Behavior: Behavior normal.        Thought Content: Thought content normal.        Judgment: Judgment normal.    Results for orders placed or performed in visit on 06/12/23  POC Rapid Strep A  Result Value Ref Range   Rapid Strep A Screen Negative Negative  POC COVID-19  Result Value Ref Range  SARS Coronavirus 2 Ag Negative Negative      Assessment & Plan:  Fever, unspecified fever cause -     POCT rapid strep A -     POC COVID-19 BinaxNow  Nonintractable headache, unspecified chronicity pattern, unspecified headache type -     POCT rapid strep A -     POC COVID-19 BinaxNow  -Negative for rapid influenza and covid.  -Since symptoms started this morning, recommend to retest at home for covid and influenza in the morning. Viral load may not be high enough to test positive. If positive tomorrow morning, please send a MyChart message, will send in antiviral medication.  -Rest, hydrate.  -Alternate Tylenol  1000mg  and Ibuprofen  600-800mg  every 4 hours for pain, headache, and body aches. Recommend to eat something when taking Ibuprofen .   -May take over the counter medication, such as Mucinex  Cold/Flue to help with symptoms. -Work note provided to return back on Monday, 06/16/2023. -Follow up if not improved.   Berdella Bacot,  NP

## 2023-06-12 NOTE — Patient Instructions (Signed)
-  Negative for rapid influenza and covid.  -Since symptoms started this morning, recommend to retest at home for covid and influenza in the morning. Viral load may not be high enough to test positive. If positive tomorrow morning, please send a MyChart message, will send in antiviral medication.  -Rest, hydrate.  -Alternate Tylenol  1000mg  and Ibuprofen  600-800mg  every 4 hours for pain, headache, and body aches. Recommend to eat something when taking Ibuprofen .   -May take over the counter medication, such as Mucinex  Cold/Flue to help with symptoms. -Work note provided to return back on Monday, 06/16/2023. -Follow up if not improved.

## 2023-06-13 ENCOUNTER — Other Ambulatory Visit (HOSPITAL_COMMUNITY): Payer: Self-pay

## 2023-06-13 ENCOUNTER — Encounter: Payer: Self-pay | Admitting: Family Medicine

## 2023-06-13 DIAGNOSIS — R509 Fever, unspecified: Secondary | ICD-10-CM

## 2023-06-13 DIAGNOSIS — R6889 Other general symptoms and signs: Secondary | ICD-10-CM

## 2023-06-13 MED ORDER — OSELTAMIVIR PHOSPHATE 75 MG PO CAPS
75.0000 mg | ORAL_CAPSULE | Freq: Two times a day (BID) | ORAL | 0 refills | Status: AC
Start: 2023-06-13 — End: 2023-06-18
  Filled 2023-06-13: qty 10, 5d supply, fill #0

## 2023-06-13 NOTE — Telephone Encounter (Signed)
 Pt is aware and medication was sent to pharmacy and I sent work note to [employer].

## 2023-06-16 ENCOUNTER — Other Ambulatory Visit (HOSPITAL_COMMUNITY): Payer: Self-pay

## 2023-06-16 MED ORDER — CIPROFLOXACIN HCL 500 MG PO TABS
500.0000 mg | ORAL_TABLET | Freq: Two times a day (BID) | ORAL | 0 refills | Status: DC
Start: 2023-06-16 — End: 2023-07-09
  Filled 2023-06-16: qty 14, 7d supply, fill #0

## 2023-07-09 ENCOUNTER — Other Ambulatory Visit (HOSPITAL_COMMUNITY): Payer: Self-pay

## 2023-07-09 DIAGNOSIS — B9689 Other specified bacterial agents as the cause of diseases classified elsewhere: Secondary | ICD-10-CM | POA: Diagnosis not present

## 2023-07-09 DIAGNOSIS — R3915 Urgency of urination: Secondary | ICD-10-CM | POA: Diagnosis not present

## 2023-07-09 DIAGNOSIS — N898 Other specified noninflammatory disorders of vagina: Secondary | ICD-10-CM | POA: Diagnosis not present

## 2023-07-09 DIAGNOSIS — N760 Acute vaginitis: Secondary | ICD-10-CM | POA: Diagnosis not present

## 2023-07-09 DIAGNOSIS — R350 Frequency of micturition: Secondary | ICD-10-CM | POA: Diagnosis not present

## 2023-07-09 MED ORDER — METRONIDAZOLE 500 MG PO TABS
500.0000 mg | ORAL_TABLET | Freq: Two times a day (BID) | ORAL | 0 refills | Status: DC
  Filled 2023-07-09: qty 14, 7d supply, fill #0

## 2023-07-09 MED ORDER — NITROFURANTOIN MACROCRYSTAL 50 MG PO CAPS
ORAL_CAPSULE | ORAL | 0 refills | Status: DC
  Filled 2023-07-09: qty 30, 30d supply, fill #0

## 2023-07-15 ENCOUNTER — Other Ambulatory Visit (HOSPITAL_COMMUNITY): Payer: Self-pay

## 2023-07-15 MED ORDER — NITROFURANTOIN MONOHYD MACRO 100 MG PO CAPS
100.0000 mg | ORAL_CAPSULE | Freq: Two times a day (BID) | ORAL | 0 refills | Status: DC | PRN
  Filled 2023-07-15: qty 14, 7d supply, fill #0

## 2023-08-01 ENCOUNTER — Other Ambulatory Visit (HOSPITAL_COMMUNITY): Payer: Self-pay

## 2023-08-01 MED ORDER — VALACYCLOVIR HCL 500 MG PO TABS
500.0000 mg | ORAL_TABLET | Freq: Every day | ORAL | 3 refills | Status: AC
  Filled 2023-08-01: qty 30, 30d supply, fill #0
  Filled 2023-09-05: qty 30, 30d supply, fill #1
  Filled 2023-10-07: qty 30, 30d supply, fill #2
  Filled 2023-11-10: qty 30, 30d supply, fill #3
  Filled 2023-12-15: qty 30, 30d supply, fill #4
  Filled 2024-01-19: qty 30, 30d supply, fill #5
  Filled 2024-02-23: qty 30, 30d supply, fill #6

## 2023-09-02 DIAGNOSIS — R350 Frequency of micturition: Secondary | ICD-10-CM | POA: Diagnosis not present

## 2023-09-05 ENCOUNTER — Other Ambulatory Visit (HOSPITAL_COMMUNITY): Payer: Self-pay

## 2023-09-16 ENCOUNTER — Other Ambulatory Visit (HOSPITAL_COMMUNITY): Payer: Self-pay

## 2023-09-16 DIAGNOSIS — R339 Retention of urine, unspecified: Secondary | ICD-10-CM | POA: Diagnosis not present

## 2023-09-16 DIAGNOSIS — N898 Other specified noninflammatory disorders of vagina: Secondary | ICD-10-CM | POA: Diagnosis not present

## 2023-09-16 DIAGNOSIS — B9689 Other specified bacterial agents as the cause of diseases classified elsewhere: Secondary | ICD-10-CM | POA: Diagnosis not present

## 2023-09-16 DIAGNOSIS — N760 Acute vaginitis: Secondary | ICD-10-CM | POA: Diagnosis not present

## 2023-09-16 DIAGNOSIS — Z113 Encounter for screening for infections with a predominantly sexual mode of transmission: Secondary | ICD-10-CM | POA: Diagnosis not present

## 2023-09-16 MED ORDER — METRONIDAZOLE 500 MG PO TABS
500.0000 mg | ORAL_TABLET | Freq: Two times a day (BID) | ORAL | 0 refills | Status: AC
  Filled 2023-09-16: qty 14, 7d supply, fill #0

## 2023-10-01 DIAGNOSIS — I83893 Varicose veins of bilateral lower extremities with other complications: Secondary | ICD-10-CM | POA: Diagnosis not present

## 2023-10-01 DIAGNOSIS — M79605 Pain in left leg: Secondary | ICD-10-CM | POA: Diagnosis not present

## 2023-10-01 DIAGNOSIS — M79661 Pain in right lower leg: Secondary | ICD-10-CM | POA: Diagnosis not present

## 2023-10-01 DIAGNOSIS — I872 Venous insufficiency (chronic) (peripheral): Secondary | ICD-10-CM | POA: Diagnosis not present

## 2023-10-01 DIAGNOSIS — M79662 Pain in left lower leg: Secondary | ICD-10-CM | POA: Diagnosis not present

## 2023-10-02 ENCOUNTER — Encounter: Payer: Self-pay | Admitting: Family Medicine

## 2023-10-08 DIAGNOSIS — I87392 Chronic venous hypertension (idiopathic) with other complications of left lower extremity: Secondary | ICD-10-CM | POA: Diagnosis not present

## 2023-11-11 ENCOUNTER — Other Ambulatory Visit (HOSPITAL_COMMUNITY): Payer: Self-pay

## 2023-11-26 DIAGNOSIS — R600 Localized edema: Secondary | ICD-10-CM | POA: Diagnosis not present

## 2023-11-26 DIAGNOSIS — R252 Cramp and spasm: Secondary | ICD-10-CM | POA: Diagnosis not present

## 2023-11-26 DIAGNOSIS — I87393 Chronic venous hypertension (idiopathic) with other complications of bilateral lower extremity: Secondary | ICD-10-CM | POA: Diagnosis not present

## 2023-11-26 DIAGNOSIS — G2581 Restless legs syndrome: Secondary | ICD-10-CM | POA: Diagnosis not present

## 2023-11-26 DIAGNOSIS — I83891 Varicose veins of right lower extremities with other complications: Secondary | ICD-10-CM | POA: Diagnosis not present

## 2023-12-11 DIAGNOSIS — Z1231 Encounter for screening mammogram for malignant neoplasm of breast: Secondary | ICD-10-CM | POA: Diagnosis not present

## 2023-12-11 DIAGNOSIS — Z130 Encounter for screening for diseases of the blood and blood-forming organs and certain disorders involving the immune mechanism: Secondary | ICD-10-CM | POA: Diagnosis not present

## 2023-12-11 DIAGNOSIS — Z01419 Encounter for gynecological examination (general) (routine) without abnormal findings: Secondary | ICD-10-CM | POA: Diagnosis not present

## 2023-12-11 DIAGNOSIS — Z1389 Encounter for screening for other disorder: Secondary | ICD-10-CM | POA: Diagnosis not present

## 2023-12-11 LAB — HM MAMMOGRAPHY

## 2023-12-22 ENCOUNTER — Telehealth: Payer: Self-pay

## 2023-12-22 NOTE — Telephone Encounter (Signed)
 Copied from CRM [id number]. Topic: Clinical - Request for Lab/Test Order >> Dec 22, 2023 11:11 AM Harlene ORN wrote: Reason for CRM: Patient is requesting lab orders for her upcoming physical on 12/04/205 Fasting   Patient requesting labs for physical .

## 2023-12-23 DIAGNOSIS — I83891 Varicose veins of right lower extremities with other complications: Secondary | ICD-10-CM | POA: Diagnosis not present

## 2023-12-24 ENCOUNTER — Other Ambulatory Visit (HOSPITAL_COMMUNITY): Payer: Self-pay

## 2023-12-24 DIAGNOSIS — I87391 Chronic venous hypertension (idiopathic) with other complications of right lower extremity: Secondary | ICD-10-CM | POA: Diagnosis not present

## 2023-12-24 MED ORDER — NITROFURANTOIN MACROCRYSTAL 50 MG PO CAPS
ORAL_CAPSULE | ORAL | 0 refills | Status: AC
  Filled 2023-12-24: qty 30, 30d supply, fill #0

## 2023-12-24 NOTE — Telephone Encounter (Signed)
 Will obtain labs at time of CPE.

## 2023-12-25 NOTE — Telephone Encounter (Signed)
 Called patient left a VM patient is aware

## 2023-12-26 ENCOUNTER — Other Ambulatory Visit (HOSPITAL_COMMUNITY): Payer: Self-pay

## 2023-12-26 MED ORDER — NITROFURANTOIN MACROCRYSTAL 100 MG PO CAPS
100.0000 mg | ORAL_CAPSULE | Freq: Two times a day (BID) | ORAL | 0 refills | Status: AC
  Filled 2023-12-26: qty 14, 7d supply, fill #0

## 2023-12-26 MED ORDER — PHENAZOPYRIDINE HCL 200 MG PO TABS
200.0000 mg | ORAL_TABLET | Freq: Three times a day (TID) | ORAL | 0 refills | Status: AC
  Filled 2023-12-26: qty 9, 3d supply, fill #0

## 2024-01-06 DIAGNOSIS — I87391 Chronic venous hypertension (idiopathic) with other complications of right lower extremity: Secondary | ICD-10-CM | POA: Diagnosis not present

## 2024-01-15 ENCOUNTER — Ambulatory Visit: Admitting: Family Medicine

## 2024-01-15 ENCOUNTER — Encounter: Payer: Self-pay | Admitting: Family Medicine

## 2024-01-15 VITALS — BP 118/78 | HR 70 | Temp 97.8°F | Wt 133.4 lb

## 2024-01-15 DIAGNOSIS — Z0000 Encounter for general adult medical examination without abnormal findings: Secondary | ICD-10-CM | POA: Diagnosis not present

## 2024-01-15 DIAGNOSIS — M25531 Pain in right wrist: Secondary | ICD-10-CM

## 2024-01-15 DIAGNOSIS — I7300 Raynaud's syndrome without gangrene: Secondary | ICD-10-CM | POA: Diagnosis not present

## 2024-01-15 DIAGNOSIS — F419 Anxiety disorder, unspecified: Secondary | ICD-10-CM | POA: Diagnosis not present

## 2024-01-15 DIAGNOSIS — R202 Paresthesia of skin: Secondary | ICD-10-CM | POA: Diagnosis not present

## 2024-01-15 DIAGNOSIS — M79604 Pain in right leg: Secondary | ICD-10-CM | POA: Diagnosis not present

## 2024-01-15 LAB — TSH: TSH: 1.98 u[IU]/mL (ref 0.35–5.50)

## 2024-01-15 LAB — T4, FREE: Free T4: 0.67 ng/dL (ref 0.60–1.60)

## 2024-01-15 LAB — LIPID PANEL
Cholesterol: 171 mg/dL (ref 0–200)
HDL: 78.5 mg/dL (ref >39.00)
LDL Cholesterol: 86 mg/dL (ref 0–99)
NonHDL: 92.59
Total CHOL/HDL Ratio: 2
Triglycerides: 33 mg/dL (ref 0.0–149.0)
VLDL: 6.6 mg/dL (ref 0.0–40.0)

## 2024-01-15 LAB — CBC WITH DIFFERENTIAL/PLATELET
Basophils Absolute: 0 K/uL (ref 0.0–0.1)
Basophils Relative: 0.6 % (ref 0.0–3.0)
Eosinophils Absolute: 0 K/uL (ref 0.0–0.7)
Eosinophils Relative: 0.9 % (ref 0.0–5.0)
HCT: 35.7 % — ABNORMAL LOW (ref 36.0–46.0)
Hemoglobin: 11.7 g/dL — ABNORMAL LOW (ref 12.0–15.0)
Lymphocytes Relative: 44.3 % (ref 12.0–46.0)
Lymphs Abs: 1.7 K/uL (ref 0.7–4.0)
MCHC: 32.7 g/dL (ref 30.0–36.0)
MCV: 87 fl (ref 78.0–100.0)
Monocytes Absolute: 0.5 K/uL (ref 0.1–1.0)
Monocytes Relative: 12.2 % — ABNORMAL HIGH (ref 3.0–12.0)
Neutro Abs: 1.6 K/uL (ref 1.4–7.7)
Neutrophils Relative %: 42 % — ABNORMAL LOW (ref 43.0–77.0)
Platelets: 304 K/uL (ref 150.0–400.0)
RBC: 4.1 Mil/uL (ref 3.87–5.11)
RDW: 15.4 % (ref 11.5–15.5)
WBC: 3.9 K/uL — ABNORMAL LOW (ref 4.0–10.5)

## 2024-01-15 LAB — COMPREHENSIVE METABOLIC PANEL WITH GFR
ALT: 21 U/L (ref 0–35)
AST: 25 U/L (ref 0–37)
Albumin: 4.1 g/dL (ref 3.5–5.2)
Alkaline Phosphatase: 46 U/L (ref 39–117)
BUN: 17 mg/dL (ref 6–23)
CO2: 26 meq/L (ref 19–32)
Calcium: 9.1 mg/dL (ref 8.4–10.5)
Chloride: 104 meq/L (ref 96–112)
Creatinine, Ser: 0.76 mg/dL (ref 0.40–1.20)
GFR: 94.08 mL/min (ref >60.00)
Glucose, Bld: 78 mg/dL (ref 70–99)
Potassium: 4.3 meq/L (ref 3.5–5.1)
Sodium: 137 meq/L (ref 135–145)
Total Bilirubin: 0.4 mg/dL (ref 0.2–1.2)
Total Protein: 7.2 g/dL (ref 6.0–8.3)

## 2024-01-15 LAB — HEMOGLOBIN A1C: Hgb A1c MFr Bld: 5.4 % (ref 4.6–6.5)

## 2024-01-15 LAB — FOLATE: Folate: >23.7 ng/mL (ref >5.9)

## 2024-01-15 LAB — VITAMIN B12: Vitamin B-12: 617 pg/mL (ref 211–911)

## 2024-01-15 NOTE — Progress Notes (Signed)
 Established Patient Office Visit   Subjective  Patient ID: Pamela Holland, female    DOB: May 30, 1975  Age: 48 y.o. MRN: [id number]  No chief complaint on file.   Patient is a 48 year old female seen for CPE and ongoing concerns.  Patient endorses sharp, intermittent right wrist pain times several months.  Occasionally will wear a brace to help with discomfort.  Patient denies numbness, tingling, edema in hand or waking up at night due to to the pain.  Also notes tingling in right shoulder down arm.  Also intermittent but may occur if leaning on elbow.  Denies weakness in RUE or dropping items.  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  Has also noticed that her fingers become numb when cold and turn white.  Patient also mentions tension headaches that are intermittent.  May have 1/month if that.  Pain last 1 minute.  Does not take anything for the pain as it is so brief.  Drinking 33 ounces of water per day.  Had vision checked earlier this year.  Now wearing glasses.  Sleep is okay most days but occasionally wakes up around 2 AM with difficulty falling asleep until 5 AM due to mind racing.  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  Patient occasionally participates in a support group.     Patient Active Problem List   Diagnosis Date Noted   Sinusitis 02/28/2023   Bacterial vaginosis 10/08/2022   Dysuria 10/08/2022   Genital herpes simplex 10/08/2022   Increased frequency of urination 10/08/2022   Symptoms involving urinary system 10/08/2022   Vaginal discharge 10/08/2022   Seasonal allergies 08/05/2022   HSV infection 08/05/2022   History of sciatica 08/05/2022   Postcoital UTI 08/05/2022   Anxiety 08/05/2022   History of palpitations 08/05/2022   Menses painful 08/05/2022   Palpitations 01/10/2014   Chest pain 01/10/2014   Past Medical History:  Diagnosis Date   Palpitations    Past Surgical History:  Procedure Laterality Date   CYSTECTOMY     DILATATION &  CURETTAGE/HYSTEROSCOPY WITH MYOSURE N/A 03/09/2021   Procedure: DILATATION & CURETTAGE/HYSTEROSCOPY WITH MYOSURE RESECTION OF MYOMA;  Surgeon: Horacio Boas, MD;  Location: [facility];  Service: Gynecology;  Laterality: N/A;   UTERINE FIBROID SURGERY     WISDOM TOOTH EXTRACTION     Social History   Tobacco Use   Smoking status: Never   Smokeless tobacco: Never  Vaping Use   Vaping status: Never Used  Substance Use Topics   Alcohol use: No   Drug use: No   Family History  Problem Relation Age of Onset   Heart disease Mother    Hypertension Mother    Diabetes Mother    Arthritis Mother    High Cholesterol Mother    Kidney disease Mother    Cancer Father        pancreatic   Hypertension Father    Heart attack Father    Heart disease Other    Heart attack Other    Stroke Other    Hypertension Other    No Known Allergies  ROS Negative unless stated above    Objective:     There were no vitals taken for this visit. BP Readings from Last 3 Encounters:  06/12/23 118/72  02/26/23 120/80  02/03/23 (!) 102/58   Wt Readings from Last 3 Encounters:  06/12/23 127 lb (57.6 kg)  02/28/23 126 lb (57.2 kg)  02/26/23 126 lb 3.2 oz (57.2  kg)      Physical Exam Constitutional:      Appearance: Normal appearance.  HENT:     Head: Normocephalic and atraumatic.     Right Ear: Tympanic membrane, ear canal and external ear normal.     Left Ear: Tympanic membrane, ear canal and external ear normal.     Nose: Nose normal.     Mouth/Throat:     Mouth: Mucous membranes are moist.     Pharynx: No oropharyngeal exudate or posterior oropharyngeal erythema.  Eyes:     General: No scleral icterus.    Extraocular Movements: Extraocular movements intact.     Conjunctiva/sclera: Conjunctivae normal.     Pupils: Pupils are equal, round, and reactive to light.  Neck:     Thyroid: No thyromegaly.     Vascular: No carotid bruit.  Cardiovascular:     Rate and Rhythm:  Normal rate and regular rhythm.     Pulses: Normal pulses.     Heart sounds: Normal heart sounds. No murmur heard.    No friction rub.  Pulmonary:     Effort: Pulmonary effort is normal.     Breath sounds: Normal breath sounds. No wheezing, rhonchi or rales.  Abdominal:     General: Bowel sounds are normal.     Palpations: Abdomen is soft.     Tenderness: There is no abdominal tenderness.  Musculoskeletal:        General: No deformity. Normal range of motion.  Lymphadenopathy:     Cervical: No cervical adenopathy.  Skin:    General: Skin is warm and dry.     Findings: No lesion.  Neurological:     General: No focal deficit present.     Mental Status: She is alert and oriented to person, place, and time.     Cranial Nerves: Cranial nerves 2-12 are intact.     Sensory: Sensation is intact.     Motor: Motor function is intact.     Coordination: Coordination is intact.     Comments: Negative Tinel and Phalen's bilaterally.  No RUE discomfort with axial loadiig.  Psychiatric:        Mood and Affect: Mood normal.        Thought Content: Thought content normal.        01/15/2024   10:18 AM 02/28/2023    4:54 PM 01/15/2023   10:35 AM  Depression screen PHQ 2/9  Decreased Interest 0 0 0  Down, Depressed, Hopeless 0 0 0  PHQ - 2 Score 0 0 0  Altered sleeping 0    Tired, decreased energy 3    Change in appetite 0    Feeling bad or failure about yourself  0    Trouble concentrating 0    Moving slowly or fidgety/restless 0    Suicidal thoughts 0    PHQ-9 Score 3    Difficult doing work/chores Somewhat difficult        01/15/2024   10:18 AM 01/15/2023   10:35 AM 08/05/2022    4:18 PM  GAD 7 : Generalized Anxiety Score  Nervous, Anxious, on Edge  0 2  Control/stop worrying 0 0 1  Worry too much - different things 1 0 1  Trouble relaxing 0 0 0  Restless 1 0 0  Easily annoyed or irritable 0 0 0  Afraid - awful might happen 0 0 0  Total GAD 7 Score  0 4  Anxiety Difficulty  Not difficult at all  Not difficult  at all     No results found for any visits on 01/15/24.    Assessment & Plan:   Well adult exam -     CBC with Differential/Platelet; Future -     Comprehensive metabolic panel with GFR; Future -     Hemoglobin A1c; Future -     Lipid panel; Future -     T4, free; Future -     TSH; Future  Anxiety -     T4, free; Future -     TSH; Future  Right wrist pain -     DG Wrist Complete Right; Future  Paresthesia -     Vitamin B12; Future -     Folate; Future  Raynaud's phenomenon without gangrene  Age health screenings discussed.  Obtain labs.  Immunizations reviewed.  Influenza vaccine done 11/2023.  Mammogram and Pap completed 11/2023 at [facility].  Colonoscopy done 03/17/2023.  Anxiety stable.  GAD-7 score 2 or more as patient did not complete 1 question.  HQ 9 score 3.  Discussed the importance of self-care.  Discussed caregiver resources.  Right wrist pain.  Discussed possible causes including arthritis.  Symptoms less likely carpal tunnel related.  Obtain x-ray of wrist at [facility] office as not available in clinic.  Paresthesias of RUE from neck down possibly related to increased tension in neck and shoulders, cervical spine arthritis, bone spurs, disc herniation.  Discussed supportive care including topical analgesics, stretching, Tylenol  or NSAIDs, heat, etc.  Consider spine x-ray for continued or worsening symptoms.  Raynaud's phenomenon.  Followed by vein and vascular.  No follow-ups on file.   Clotilda JONELLE Single, MD

## 2024-01-23 ENCOUNTER — Other Ambulatory Visit (HOSPITAL_COMMUNITY): Payer: Self-pay

## 2024-01-23 DIAGNOSIS — R3915 Urgency of urination: Secondary | ICD-10-CM | POA: Diagnosis not present

## 2024-01-23 DIAGNOSIS — N760 Acute vaginitis: Secondary | ICD-10-CM | POA: Diagnosis not present

## 2024-01-23 DIAGNOSIS — R102 Pelvic and perineal pain unspecified side: Secondary | ICD-10-CM | POA: Diagnosis not present

## 2024-01-23 DIAGNOSIS — B9689 Other specified bacterial agents as the cause of diseases classified elsewhere: Secondary | ICD-10-CM | POA: Diagnosis not present

## 2024-01-23 MED ORDER — METRONIDAZOLE 500 MG PO TABS
500.0000 mg | ORAL_TABLET | Freq: Two times a day (BID) | ORAL | 0 refills | Status: AC
  Filled 2024-01-23: qty 14, 7d supply, fill #0

## 2024-01-27 ENCOUNTER — Other Ambulatory Visit (HOSPITAL_COMMUNITY): Payer: Self-pay

## 2024-01-27 MED ORDER — DOXYCYCLINE HYCLATE 100 MG PO CAPS
100.0000 mg | ORAL_CAPSULE | Freq: Two times a day (BID) | ORAL | 0 refills | Status: AC
  Filled 2024-01-27: qty 14, 7d supply, fill #0

## 2024-01-28 ENCOUNTER — Ambulatory Visit: Payer: Self-pay | Admitting: Family Medicine

## 2024-02-16 ENCOUNTER — Other Ambulatory Visit (HOSPITAL_COMMUNITY): Payer: Self-pay

## 2024-02-16 MED ORDER — SULFAMETHOXAZOLE-TRIMETHOPRIM 800-160 MG PO TABS
1.0000 | ORAL_TABLET | Freq: Two times a day (BID) | ORAL | 0 refills | Status: DC
  Filled 2024-02-16: qty 6, 3d supply, fill #0

## 2024-02-18 ENCOUNTER — Other Ambulatory Visit: Payer: Self-pay | Admitting: Family Medicine

## 2024-02-18 ENCOUNTER — Other Ambulatory Visit (HOSPITAL_COMMUNITY): Payer: Self-pay

## 2024-02-18 DIAGNOSIS — N946 Dysmenorrhea, unspecified: Secondary | ICD-10-CM

## 2024-02-18 MED ORDER — IBUPROFEN 800 MG PO TABS
800.0000 mg | ORAL_TABLET | Freq: Two times a day (BID) | ORAL | 2 refills | Status: AC | PRN
  Filled 2024-02-18: qty 30, 15d supply, fill #0

## 2024-02-20 ENCOUNTER — Ambulatory Visit: Payer: Self-pay

## 2024-02-20 NOTE — Telephone Encounter (Signed)
 FYI Only or Action Required?: FYI only for provider: Urgent Care advised.  Patient was last seen in primary care on 01/15/2024 by Pamela Clotilda SAUNDERS, MD.  Called Nurse Triage reporting Headache.  Symptoms began several days ago.  Interventions attempted: OTC medications: Advil  and Tylenol  as needed and Rest, hydration, or home remedies.  Symptoms are: gradually worsening.  Triage Disposition: See Physician Within 24 Hours  Patient/caregiver understands and will follow disposition?: Yes, but will wait   --Urgent Care tomorrow per patient              Copied from CRM #[id number]. Topic: Clinical - Red Word Triage >> Feb 20, 2024  1:30 PM Pamela Holland wrote: Red Word that prompted transfer to Nurse Triage: Patient states she has been having headaches the past 3 days, vomitted last night, now have a fever of 100.7. Progressively gotten worse. Headaches are very uncommon for her. Very cold off and on since last night, weakness, and fatigue. 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  Patient states that she went to her OBGYN yesterday for a visit and her blood pressure was fine. She denies any pain in one eye, trouble walking, trouble talking, weakness, numbness, nausea, vomiting today, severe headache, changes in vision, history of diabetes, injuries  She states that her headaches ease off when she  takes medication such as Tylenol  or Ibuprofen .  Patient is advised it is recommended for her to be seen in the next 24 hours and with no openings at her PCP office today or in any other offices within the region it is recommended that she goes to Urgent Care Patient states she will go to Urgent Care tomorrow morning but today she is going to rest and continue home care. She is advised of precautions for ER evaluation and also advised if anything gets worse to call 911.  Patient is advised to call us  back if anything changes or with any further questions/concerns. Patient is advised that if anything worsens to go to the Emergency Room. Patient verbalized understanding.  Protocols used: [facility]
